# Patient Record
Sex: Female | Born: 1985 | Race: Black or African American | Hispanic: No | Smoking: Former smoker
Health system: Southern US, Community
[De-identification: ages and names within clinical notes are randomized; demographics above are authoritative.]

## PROBLEM LIST (undated history)

## (undated) DIAGNOSIS — J45909 Unspecified asthma, uncomplicated: Secondary | ICD-10-CM

## (undated) DIAGNOSIS — E669 Obesity, unspecified: Secondary | ICD-10-CM

## (undated) DIAGNOSIS — J189 Pneumonia, unspecified organism: Secondary | ICD-10-CM

## (undated) HISTORY — PX: WRIST SURGERY: SHX841

## (undated) HISTORY — PX: OTHER SURGICAL HISTORY: SHX169

---
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
## 2006-04-29 ENCOUNTER — Emergency Department (HOSPITAL_COMMUNITY): Admission: EM | Admit: 2006-04-29 | Discharge: 2006-04-29 | Payer: Self-pay | Admitting: Emergency Medicine

## 2006-05-06 ENCOUNTER — Inpatient Hospital Stay (HOSPITAL_COMMUNITY): Admission: AD | Admit: 2006-05-06 | Discharge: 2006-05-07 | Payer: Self-pay | Admitting: Obstetrics and Gynecology

## 2006-06-02 ENCOUNTER — Emergency Department (HOSPITAL_COMMUNITY): Admission: EM | Admit: 2006-06-02 | Discharge: 2006-06-02 | Payer: Self-pay | Admitting: Family Medicine

## 2006-06-18 ENCOUNTER — Ambulatory Visit: Payer: Self-pay | Admitting: *Deleted

## 2006-06-18 ENCOUNTER — Inpatient Hospital Stay (HOSPITAL_COMMUNITY): Admission: AD | Admit: 2006-06-18 | Discharge: 2006-06-18 | Payer: Self-pay | Admitting: Obstetrics & Gynecology

## 2006-06-24 ENCOUNTER — Ambulatory Visit: Payer: Self-pay | Admitting: Obstetrics and Gynecology

## 2006-06-25 ENCOUNTER — Ambulatory Visit (HOSPITAL_COMMUNITY): Admission: RE | Admit: 2006-06-25 | Discharge: 2006-06-25 | Payer: Self-pay | Admitting: Obstetrics & Gynecology

## 2006-07-07 ENCOUNTER — Inpatient Hospital Stay (HOSPITAL_COMMUNITY): Admission: AD | Admit: 2006-07-07 | Discharge: 2006-07-08 | Payer: Self-pay | Admitting: Obstetrics and Gynecology

## 2006-07-07 ENCOUNTER — Ambulatory Visit: Payer: Self-pay | Admitting: *Deleted

## 2006-07-22 ENCOUNTER — Ambulatory Visit: Payer: Self-pay | Admitting: *Deleted

## 2006-07-24 ENCOUNTER — Ambulatory Visit: Payer: Self-pay | Admitting: Obstetrics & Gynecology

## 2006-07-24 ENCOUNTER — Encounter: Payer: Self-pay | Admitting: Obstetrics & Gynecology

## 2006-08-05 ENCOUNTER — Ambulatory Visit: Payer: Self-pay | Admitting: Obstetrics & Gynecology

## 2006-08-26 ENCOUNTER — Ambulatory Visit: Payer: Self-pay | Admitting: Obstetrics & Gynecology

## 2006-09-09 ENCOUNTER — Ambulatory Visit: Payer: Self-pay | Admitting: *Deleted

## 2006-09-14 ENCOUNTER — Ambulatory Visit: Payer: Self-pay | Admitting: Obstetrics & Gynecology

## 2006-09-15 ENCOUNTER — Ambulatory Visit: Payer: Self-pay | Admitting: Obstetrics & Gynecology

## 2006-09-21 ENCOUNTER — Inpatient Hospital Stay (HOSPITAL_COMMUNITY): Admission: AD | Admit: 2006-09-21 | Discharge: 2006-09-21 | Payer: Self-pay | Admitting: Obstetrics and Gynecology

## 2006-09-21 ENCOUNTER — Ambulatory Visit: Payer: Self-pay | Admitting: *Deleted

## 2006-09-23 ENCOUNTER — Ambulatory Visit: Payer: Self-pay | Admitting: Obstetrics & Gynecology

## 2006-09-30 ENCOUNTER — Ambulatory Visit: Payer: Self-pay | Admitting: Obstetrics & Gynecology

## 2006-10-01 ENCOUNTER — Ambulatory Visit: Payer: Self-pay | Admitting: Gynecology

## 2006-10-01 ENCOUNTER — Ambulatory Visit (HOSPITAL_COMMUNITY): Admission: RE | Admit: 2006-10-01 | Discharge: 2006-10-01 | Payer: Self-pay | Admitting: Family Medicine

## 2006-10-05 ENCOUNTER — Ambulatory Visit: Payer: Self-pay | Admitting: Obstetrics & Gynecology

## 2006-10-07 ENCOUNTER — Ambulatory Visit: Payer: Self-pay | Admitting: Obstetrics & Gynecology

## 2006-10-10 ENCOUNTER — Ambulatory Visit: Payer: Self-pay | Admitting: Obstetrics & Gynecology

## 2006-10-10 ENCOUNTER — Inpatient Hospital Stay (HOSPITAL_COMMUNITY): Admission: AD | Admit: 2006-10-10 | Discharge: 2006-10-10 | Payer: Self-pay | Admitting: Obstetrics and Gynecology

## 2006-10-12 ENCOUNTER — Ambulatory Visit: Payer: Self-pay | Admitting: Obstetrics and Gynecology

## 2006-10-12 ENCOUNTER — Inpatient Hospital Stay (HOSPITAL_COMMUNITY): Admission: AD | Admit: 2006-10-12 | Discharge: 2006-10-14 | Payer: Self-pay | Admitting: Obstetrics and Gynecology

## 2006-12-03 ENCOUNTER — Ambulatory Visit: Payer: Self-pay | Admitting: Obstetrics and Gynecology

## 2006-12-03 ENCOUNTER — Encounter: Payer: Self-pay | Admitting: Obstetrics and Gynecology

## 2006-12-16 ENCOUNTER — Ambulatory Visit: Payer: Self-pay | Admitting: Obstetrics & Gynecology

## 2007-03-15 ENCOUNTER — Emergency Department (HOSPITAL_COMMUNITY): Admission: EM | Admit: 2007-03-15 | Discharge: 2007-03-15 | Payer: Self-pay | Admitting: Family Medicine

## 2007-03-21 ENCOUNTER — Emergency Department (HOSPITAL_COMMUNITY): Admission: EM | Admit: 2007-03-21 | Discharge: 2007-03-21 | Payer: Self-pay | Admitting: Family Medicine

## 2011-01-03 LAB — POCT RAPID STREP A: Streptococcus, Group A Screen (Direct): NEGATIVE

## 2011-01-09 LAB — POCT PREGNANCY, URINE: Preg Test, Ur: NEGATIVE

## 2011-01-13 LAB — CBC
MCHC: 32.2
MCV: 74.2 — ABNORMAL LOW
Platelets: 218
WBC: 10.6 — ABNORMAL HIGH

## 2011-01-14 LAB — POCT URINALYSIS DIP (DEVICE)
Nitrite: NEGATIVE
Operator id: 148111
Operator id: 159681
Protein, ur: 30 — AB
Protein, ur: NEGATIVE
Urobilinogen, UA: 0.2
Urobilinogen, UA: 0.2

## 2011-01-14 LAB — CBC
Platelets: 284
WBC: 10.7 — ABNORMAL HIGH

## 2011-01-14 LAB — RPR: RPR Ser Ql: NONREACTIVE

## 2011-01-15 LAB — URINALYSIS, ROUTINE W REFLEX MICROSCOPIC
Bilirubin Urine: NEGATIVE
Nitrite: NEGATIVE
Specific Gravity, Urine: 1.02
Urobilinogen, UA: 0.2

## 2011-01-15 LAB — POCT URINALYSIS DIP (DEVICE)
Glucose, UA: NEGATIVE
Hgb urine dipstick: NEGATIVE
Nitrite: NEGATIVE

## 2011-01-15 LAB — URINE MICROSCOPIC-ADD ON

## 2011-01-15 LAB — WET PREP, GENITAL: Clue Cells Wet Prep HPF POC: NONE SEEN

## 2011-01-16 LAB — POCT URINALYSIS DIP (DEVICE)
Bilirubin Urine: NEGATIVE
Glucose, UA: NEGATIVE
Hgb urine dipstick: NEGATIVE
Nitrite: NEGATIVE
Operator id: 159681

## 2011-09-17 ENCOUNTER — Emergency Department (INDEPENDENT_AMBULATORY_CARE_PROVIDER_SITE_OTHER)
Admission: EM | Admit: 2011-09-17 | Discharge: 2011-09-17 | Disposition: A | Discharge status: Home / Self Care | Source: Home / Self Care | Attending: Family Medicine | Admitting: Family Medicine

## 2011-09-17 ENCOUNTER — Encounter (HOSPITAL_COMMUNITY): Payer: Self-pay | Admitting: *Deleted

## 2011-09-17 DIAGNOSIS — N912 Amenorrhea, unspecified: Secondary | ICD-10-CM

## 2011-09-17 DIAGNOSIS — N911 Secondary amenorrhea: Secondary | ICD-10-CM

## 2011-09-17 HISTORY — DX: Unspecified asthma, uncomplicated: J45.909

## 2011-09-17 LAB — POCT PREGNANCY, URINE: Preg Test, Ur: NEGATIVE

## 2011-09-17 NOTE — ED Notes (Addendum)
Pt requesting pregnancy test last menstrual cycle 4/10 - pt has IUD but has been having periods since October 2012

## 2011-09-17 NOTE — ED Provider Notes (Signed)
History     CSN: [id number]  Arrival date & time 09/17/11  1333   First MD Initiated Contact with Patient 09/17/11 1337      Chief Complaint  Patient presents with  . Possible Pregnancy    (Consider location/radiation/quality/duration/timing/severity/associated sxs/prior treatment) Patient is a 26 y.o. female presenting with female genitourinary complaint. The history is provided by the patient.  Female GU Problem Primary symptoms include no pelvic pain and no vaginal bleeding. There has been no fever. This is a new problem. She is not pregnant. Her LMP was months ago. The patient's menstrual history has been regular (on merina iud for 65yr, , no menses since 4/10). She uses an IUD for contraception. Associated medical issues comments: g2p2 , no other sx..    Past Medical History  Diagnosis Date  . Asthma     Past Surgical History  Procedure Date  . Wrist surgery     No family history on file.  History  Substance Use Topics  . Smoking status: Current Everyday Smoker  . Smokeless tobacco: Not on file  . Alcohol Use: Yes    OB History    Grav Para Term Preterm Abortions TAB SAB Ect Mult Living                  Review of Systems  Genitourinary: Negative for vaginal bleeding and pelvic pain.  All other systems reviewed and are negative.    Allergies  Review of patient's allergies indicates no known allergies.  Home Medications  No current outpatient prescriptions on file.  BP 102/63  Pulse 90  Temp 98.2 F (36.8 C) (Oral)  Resp 16  SpO2 100%  LMP 07/03/2011  Physical Exam  Nursing note and vitals reviewed. Constitutional: She is oriented to person, place, and time. She appears well-developed and well-nourished.  Abdominal: Soft. Bowel sounds are normal. There is no tenderness.  Neurological: She is alert and oriented to person, place, and time.  Skin: Skin is warm and dry.    ED Course  Procedures (including critical care time)   Labs Reviewed    POCT PREGNANCY, URINE   No results found.   1. Secondary physiologic amenorrhea       MDM  upreg --neg        Linna Hoff, MD 09/17/11 1544

## 2011-09-17 NOTE — Discharge Instructions (Signed)
See your gynecologist for iud change.

## 2012-10-02 ENCOUNTER — Emergency Department (HOSPITAL_COMMUNITY)
Admission: EM | Admit: 2012-10-02 | Discharge: 2012-10-02 | Disposition: A | Discharge status: Home / Self Care | Payer: Medicaid Other | Source: Home / Self Care | Attending: Emergency Medicine | Admitting: Emergency Medicine

## 2012-10-02 ENCOUNTER — Encounter (HOSPITAL_COMMUNITY): Payer: Self-pay | Admitting: Emergency Medicine

## 2012-10-02 DIAGNOSIS — R110 Nausea: Secondary | ICD-10-CM

## 2012-10-02 LAB — COMPREHENSIVE METABOLIC PANEL
ALT: 12 U/L (ref 0–35)
AST: 15 U/L (ref 0–37)
Alkaline Phosphatase: 62 U/L (ref 39–117)
CO2: 26 mEq/L (ref 19–32)
GFR calc Af Amer: >90 mL/min (ref >90)
GFR calc non Af Amer: 89 mL/min — ABNORMAL LOW (ref >90)
Glucose, Bld: 84 mg/dL (ref 70–99)
Potassium: 4 mEq/L (ref 3.5–5.1)
Sodium: 141 mEq/L (ref 135–145)
Total Protein: 6.9 g/dL (ref 6.0–8.3)

## 2012-10-02 LAB — CBC WITH DIFFERENTIAL/PLATELET
Basophils Absolute: 0 10*3/uL (ref 0.0–0.1)
Lymphocytes Relative: 32 % (ref 12–46)
Lymphs Abs: 2.5 10*3/uL (ref 0.7–4.0)
Neutrophils Relative %: 56 % (ref 43–77)
Platelets: 208 10*3/uL (ref 150–400)
RBC: 4.42 MIL/uL (ref 3.87–5.11)
WBC: 7.8 10*3/uL (ref 4.0–10.5)

## 2012-10-02 LAB — POCT H PYLORI SCREEN: H. PYLORI SCREEN, POC: NEGATIVE

## 2012-10-02 LAB — POCT I-STAT, CHEM 8
BUN: 12 mg/dL (ref 6–23)
Calcium, Ion: 1.24 mmol/L — ABNORMAL HIGH (ref 1.12–1.23)
Glucose, Bld: 82 mg/dL (ref 70–99)
TCO2: 26 mmol/L (ref 0–100)

## 2012-10-02 LAB — POCT URINALYSIS DIP (DEVICE)
Bilirubin Urine: NEGATIVE
Glucose, UA: NEGATIVE mg/dL
Nitrite: NEGATIVE

## 2012-10-02 LAB — POCT PREGNANCY, URINE: Preg Test, Ur: NEGATIVE

## 2012-10-02 MED ORDER — ONDANSETRON 4 MG PO TBDP
ORAL_TABLET | ORAL | Status: AC
  Filled 2012-10-02: qty 2

## 2012-10-02 MED ORDER — ONDANSETRON 8 MG PO TBDP: 8.0000 mg | ORAL_TABLET | Freq: Three times a day (TID) | ORAL | Status: DC | PRN

## 2012-10-02 MED ORDER — ONDANSETRON 4 MG PO TBDP
8.0000 mg | ORAL_TABLET | Freq: Once | ORAL | Status: AC
  Administered 2012-10-02: 8 mg via ORAL

## 2012-10-02 NOTE — ED Notes (Signed)
Pt c/o stomach pain that comes and goes. Pt is having some mild nausea, denies vomiting. Mild diarrhea. Denies any other symptoms. Pt is sitting upright no signs of distress.

## 2012-10-02 NOTE — ED Provider Notes (Signed)
Medical screening examination/treatment/procedure(s) were performed by non-physician practitioner and as supervising physician I was immediately available for consultation/collaboration.  Leslee Home, M.D.  Reuben Likes, MD 10/02/12 [phone]

## 2012-10-02 NOTE — ED Provider Notes (Signed)
History    CSN: [id number] Arrival date & time 10/02/12  1359  First MD Initiated Contact with Patient 10/02/12 1431     Chief Complaint  Patient presents with  . Abdominal Pain    comes and goes. nausea.mild diarrhea.    (Consider location/radiation/quality/duration/timing/severity/associated sxs/prior Treatment) HPI Comments: 27 year old female presents complaining of intermittent nausea with occasional vomiting as well as intermittent diarrhea for the past 2 months. She also has some associated abdominal pain in the left lower quadrant. As she says the nausea is the worst part of it. It can be brought on by smelling food or sometimes present first thing in the mornings. When she is nauseous first thing in the morning, that is usually when she has the episodes of vomiting. There are no alleviating or exacerbating factors. She does not recall her last menstrual period but does say she has a very old IUD in that she cannot get anyone to remove. She denies fever, chills, rash.  Patient is a 27 y.o. female presenting with abdominal pain.  Abdominal Pain Associated symptoms include abdominal pain. Pertinent negatives include no chest pain and no shortness of breath.   Past Medical History  Diagnosis Date  . Asthma    Past Surgical History  Procedure Laterality Date  . Wrist surgery     History reviewed. No pertinent family history. History  Substance Use Topics  . Smoking status: Current Every Day Smoker  . Smokeless tobacco: Not on file  . Alcohol Use: Yes   OB History   Grav Para Term Preterm Abortions TAB SAB Ect Mult Living                 Review of Systems  Constitutional: Negative for fever and chills.  Eyes: Negative for visual disturbance.  Respiratory: Negative for cough and shortness of breath.   Cardiovascular: Negative for chest pain, palpitations and leg swelling.  Gastrointestinal: Positive for nausea, vomiting, abdominal pain and diarrhea. Negative for  constipation and abdominal distention.  Endocrine: Negative for polydipsia and polyuria.  Genitourinary: Negative for dysuria, urgency and frequency.  Musculoskeletal: Negative for myalgias and arthralgias.  Skin: Negative for rash.  Neurological: Negative for dizziness, weakness and light-headedness.    Allergies  Review of patient's allergies indicates no known allergies.  Home Medications   Current Outpatient Rx  Name  Route  Sig  Dispense  Refill  . ondansetron (ZOFRAN-ODT) 8 MG disintegrating tablet   Oral   Take 1 tablet (8 mg total) by mouth every 8 (eight) hours as needed for nausea.   20 tablet   0    BP 125/66  Pulse 76  Temp(Src) 98.7 F (37.1 C) (Oral)  SpO2 100% Physical Exam  Nursing note and vitals reviewed. Constitutional: She is oriented to person, place, and time. Vital signs are normal. She appears well-developed and well-nourished. No distress.  Obese habitus   HENT:  Head: Atraumatic.  Eyes: EOM are normal. Pupils are equal, round, and reactive to light.  Cardiovascular: Normal rate, regular rhythm and normal heart sounds.  Exam reveals no gallop and no friction rub.   No murmur heard. Pulmonary/Chest: Effort normal and breath sounds normal. No respiratory distress. She has no wheezes. She has no rales.  Abdominal: Soft. Normal appearance and bowel sounds are normal. There is no hepatosplenomegaly. There is tenderness in the left lower quadrant. There is no rigidity, no rebound, no guarding, no CVA tenderness, no tenderness at McBurney's point and negative Murphy's sign. No hernia.  Neurological: She is alert and oriented to person, place, and time. She has normal strength.  Skin: Skin is warm and dry. She is not diaphoretic.  Psychiatric: She has a normal mood and affect. Her behavior is normal. Judgment normal.    ED Course  Procedures (including critical care time) Labs Reviewed  CBC WITH DIFFERENTIAL - Abnormal; Notable for the following:     Hemoglobin 11.9 (*)    HCT 35.4 (*)    Eosinophils Relative 6 (*)    All other components within normal limits  POCT URINALYSIS DIP (DEVICE) - Abnormal; Notable for the following:    Leukocytes, UA TRACE (*)    All other components within normal limits  POCT I-STAT, CHEM 8 - Abnormal; Notable for the following:    Calcium, Ion 1.24 (*)    All other components within normal limits  COMPREHENSIVE METABOLIC PANEL  POCT PREGNANCY, URINE  POCT H PYLORI SCREEN   No results found. 1. Nausea     MDM  No clear cause found during PE or labs.  Will treat her nausea with zofran ODT bc that did help here and she will f/u with her PCP.  If CMP is abnormal we will call her.     Meds ordered this encounter  Medications  . ondansetron (ZOFRAN-ODT) disintegrating tablet 8 mg    Sig:   . ondansetron (ZOFRAN-ODT) 8 MG disintegrating tablet    Sig: Take 1 tablet (8 mg total) by mouth every 8 (eight) hours as needed for nausea.    Dispense:  20 tablet    Refill:  0     Graylon Good, PA-C 10/02/12 1605

## 2012-12-12 ENCOUNTER — Emergency Department (HOSPITAL_COMMUNITY): Payer: Medicaid Other

## 2012-12-12 ENCOUNTER — Emergency Department (HOSPITAL_COMMUNITY)
Admission: EM | Admit: 2012-12-12 | Discharge: 2012-12-12 | Disposition: A | Discharge status: Home / Self Care | Payer: Medicaid Other | Attending: Emergency Medicine | Admitting: Emergency Medicine

## 2012-12-12 ENCOUNTER — Encounter (HOSPITAL_COMMUNITY): Payer: Self-pay | Admitting: Emergency Medicine

## 2012-12-12 DIAGNOSIS — R112 Nausea with vomiting, unspecified: Secondary | ICD-10-CM | POA: Insufficient documentation

## 2012-12-12 DIAGNOSIS — J45909 Unspecified asthma, uncomplicated: Secondary | ICD-10-CM | POA: Insufficient documentation

## 2012-12-12 DIAGNOSIS — Z3202 Encounter for pregnancy test, result negative: Secondary | ICD-10-CM | POA: Insufficient documentation

## 2012-12-12 DIAGNOSIS — F17200 Nicotine dependence, unspecified, uncomplicated: Secondary | ICD-10-CM | POA: Insufficient documentation

## 2012-12-12 DIAGNOSIS — N390 Urinary tract infection, site not specified: Secondary | ICD-10-CM | POA: Insufficient documentation

## 2012-12-12 LAB — COMPREHENSIVE METABOLIC PANEL
Albumin: 4.4 g/dL (ref 3.5–5.2)
BUN: 13 mg/dL (ref 6–23)
Chloride: 104 mEq/L (ref 96–112)
Creatinine, Ser: 0.97 mg/dL (ref 0.50–1.10)
GFR calc Af Amer: >90 mL/min (ref >90)
Glucose, Bld: 93 mg/dL (ref 70–99)
Total Bilirubin: 0.2 mg/dL — ABNORMAL LOW (ref 0.3–1.2)
Total Protein: 7.1 g/dL (ref 6.0–8.3)

## 2012-12-12 LAB — CBC WITH DIFFERENTIAL/PLATELET
Basophils Relative: 0 % (ref 0–1)
Eosinophils Absolute: 0.3 10*3/uL (ref 0.0–0.7)
HCT: 38.1 % (ref 36.0–46.0)
Hemoglobin: 12.7 g/dL (ref 12.0–15.0)
MCH: 26.5 pg (ref 26.0–34.0)
MCHC: 33.3 g/dL (ref 30.0–36.0)
Monocytes Absolute: 0.5 10*3/uL (ref 0.1–1.0)
Monocytes Relative: 7 % (ref 3–12)

## 2012-12-12 LAB — URINE MICROSCOPIC-ADD ON

## 2012-12-12 LAB — URINALYSIS, ROUTINE W REFLEX MICROSCOPIC
Glucose, UA: NEGATIVE mg/dL
Nitrite: NEGATIVE
Protein, ur: NEGATIVE mg/dL
Urobilinogen, UA: 1 mg/dL (ref 0.0–1.0)

## 2012-12-12 LAB — POCT PREGNANCY, URINE: Preg Test, Ur: NEGATIVE

## 2012-12-12 MED ORDER — ONDANSETRON 4 MG PO TBDP
4.0000 mg | ORAL_TABLET | Freq: Once | ORAL | Status: AC
  Administered 2012-12-12: 4 mg via ORAL
  Filled 2012-12-12: qty 1

## 2012-12-12 MED ORDER — ONDANSETRON 8 MG PO TBDP: 8.0000 mg | ORAL_TABLET | Freq: Three times a day (TID) | ORAL | Status: DC | PRN

## 2012-12-12 MED ORDER — CEPHALEXIN 500 MG PO CAPS: 500.0000 mg | ORAL_CAPSULE | Freq: Four times a day (QID) | ORAL | Status: DC

## 2012-12-12 MED ORDER — PHENAZOPYRIDINE HCL 200 MG PO TABS: 200.0000 mg | ORAL_TABLET | Freq: Three times a day (TID) | ORAL | Status: DC

## 2012-12-12 NOTE — ED Notes (Signed)
Pt c/o intermittent abdominal pain with nausea. Pt reports nausea for past 2 1/2 months with the abdominal pain intermittently ongoing longer. Pt reports has IUD placed 6 years ago. Pt has tried to get her Dr to take it out but they won't because they did not put it in and [facility] is not accepting new pts.

## 2012-12-12 NOTE — ED Provider Notes (Addendum)
CSN: [id number]     Arrival date & time 12/12/12  0800 History  First MD Initiated Contact with Patient 12/12/12 0805     Chief Complaint  Patient presents with  . Abdominal Pain  . Nausea  . Emesis    Patient is a 27 y.o. female presenting with abdominal pain and vomiting.  Abdominal Pain Associated symptoms: vomiting   Associated symptoms: no dysuria and no fever   Emesis Associated symptoms: abdominal pain    Pt woke up with a little bit of abdominal discomfort.  It was more on the left hand side generally in the lower abdomen.It has resolved but on her way to work this morning she had to pull over on the side of the road to vomit.  She continues to feel nauseated but not as bad.   She has been having this trouble off and on for the last few months with the nausea and even longer the pain.  She has tried to see her PCP GYN to get her IUD removed but they want her to see the providers that did the procedure and [facility]. Pt cant think of anything that brings it on.  No relation to eating or food. Not at a certain time of the day. Past Medical History  Diagnosis Date  . Asthma    Past Surgical History  Procedure Laterality Date  . Wrist surgery     No family history on file. History  Substance Use Topics  . Smoking status: Current Every Day Smoker  . Smokeless tobacco: Not on file  . Alcohol Use: Yes   OB History   Grav Para Term Preterm Abortions TAB SAB Ect Mult Living                 Review of Systems  Constitutional: Negative for fever.  Gastrointestinal: Positive for vomiting and abdominal pain.  Genitourinary: Negative for dysuria.  All other systems reviewed and are negative.    Allergies  Review of patient's allergies indicates no known allergies.  Home Medications   Current Outpatient Rx  Name  Route  Sig  Dispense  Refill  . naproxen sodium (ANAPROX) 220 MG tablet   Oral   Take 220-440 mg by mouth daily as needed  (Joint pain).         . cephALEXin (KEFLEX) 500 MG capsule   Oral   Take 1 capsule (500 mg total) by mouth 4 (four) times daily.   20 capsule   0   . ondansetron (ZOFRAN ODT) 8 MG disintegrating tablet   Oral   Take 1 tablet (8 mg total) by mouth every 8 (eight) hours as needed for nausea.   20 tablet   0   . phenazopyridine (PYRIDIUM) 200 MG tablet   Oral   Take 1 tablet (200 mg total) by mouth 3 (three) times daily.   6 tablet   0    BP 118/65  Pulse 65  Temp(Src) 98.3 F (36.8 C) (Oral)  Resp 18  Ht 5\' 6"  (1.676 m)  SpO2 100% Physical Exam  Nursing note and vitals reviewed. Constitutional: She appears well-developed and well-nourished. No distress.  HENT:  Head: Normocephalic and atraumatic.  Right Ear: External ear normal.  Left Ear: External ear normal.  Eyes: Conjunctivae are normal. Right eye exhibits no discharge. Left eye exhibits no discharge. No scleral icterus.  Neck: Neck supple. No tracheal deviation present.  Cardiovascular: Normal rate, regular rhythm and intact distal  pulses.   Pulmonary/Chest: Effort normal and breath sounds normal. No stridor. No respiratory distress. She has no wheezes. She has no rales.  Abdominal: Soft. Bowel sounds are normal. She exhibits no distension. There is no tenderness. There is no rebound and no guarding.  Musculoskeletal: She exhibits no edema and no tenderness.  Neurological: She is alert. She has normal strength. No sensory deficit. Cranial nerve deficit:  no gross defecits noted. She exhibits normal muscle tone. She displays no seizure activity. Coordination normal.  Skin: Skin is warm and dry. No rash noted.  Psychiatric: She has a normal mood and affect.    ED Course  Procedures (including critical care time) Labs Review Labs Reviewed  COMPREHENSIVE METABOLIC PANEL - Abnormal; Notable for the following:    Total Bilirubin 0.2 (*)    GFR calc non Af Amer 80 (*)    All other components within normal limits   URINALYSIS, ROUTINE W REFLEX MICROSCOPIC - Abnormal; Notable for the following:    APPearance CLOUDY (*)    Leukocytes, UA MODERATE (*)    All other components within normal limits  URINE MICROSCOPIC-ADD ON - Abnormal; Notable for the following:    Squamous Epithelial / LPF FEW (*)    Bacteria, UA FEW (*)    All other components within normal limits  URINE CULTURE  CBC WITH DIFFERENTIAL  POCT PREGNANCY, URINE   Imaging Review Dg Abd Acute W/chest  12/12/2012   CLINICAL DATA:  Abdominal pain, nausea, vomiting  EXAM: ACUTE ABDOMEN SERIES (ABDOMEN 2 VIEW & CHEST 1 VIEW)  COMPARISON:  None.  FINDINGS: Cardiomediastinal silhouette is unremarkable. No acute infiltrate or pleural effusion. No pulmonary edema. There is nonspecific nonobstructive bowel gas pattern. No free abdominal air. Some stool are noted in right colon and transverse colon. IUD noted midline pelvis.  IMPRESSION: No acute disease. Nonspecific nonobstructive bowel gas pattern. No free abdominal air.   Electronically Signed   By: Tomasita Mead   On: 12/12/2012 09:36    MDM   1. UTI (urinary tract infection)     the patient's evaluation is consistent with a urinary tract infection. Discharge her home on a course of oral antibiotics as well as Zofran and Pyridium for symptomatic relief. I encouraged her to followup with a gynecologist regarding her IUD. Plain x-rays suggested it is in appropriate location.    Celene Kras, MD 12/12/12 1135  Celene Kras, MD 12/21/12 [phone]

## 2012-12-13 LAB — URINE CULTURE: Colony Count: 40000

## 2013-01-17 ENCOUNTER — Encounter (HOSPITAL_COMMUNITY): Payer: Self-pay | Admitting: Emergency Medicine

## 2013-01-17 DIAGNOSIS — N390 Urinary tract infection, site not specified: Secondary | ICD-10-CM | POA: Insufficient documentation

## 2013-01-17 DIAGNOSIS — J45909 Unspecified asthma, uncomplicated: Secondary | ICD-10-CM | POA: Insufficient documentation

## 2013-01-17 DIAGNOSIS — Z8701 Personal history of pneumonia (recurrent): Secondary | ICD-10-CM | POA: Insufficient documentation

## 2013-01-17 DIAGNOSIS — R1084 Generalized abdominal pain: Secondary | ICD-10-CM | POA: Insufficient documentation

## 2013-01-17 DIAGNOSIS — A5909 Other urogenital trichomoniasis: Secondary | ICD-10-CM | POA: Insufficient documentation

## 2013-01-17 DIAGNOSIS — F17200 Nicotine dependence, unspecified, uncomplicated: Secondary | ICD-10-CM | POA: Insufficient documentation

## 2013-01-17 DIAGNOSIS — Z79899 Other long term (current) drug therapy: Secondary | ICD-10-CM | POA: Insufficient documentation

## 2013-01-17 DIAGNOSIS — Z3202 Encounter for pregnancy test, result negative: Secondary | ICD-10-CM | POA: Insufficient documentation

## 2013-01-17 LAB — COMPREHENSIVE METABOLIC PANEL
ALT: 13 U/L (ref 0–35)
Albumin: 3.9 g/dL (ref 3.5–5.2)
Alkaline Phosphatase: 70 U/L (ref 39–117)
BUN: 15 mg/dL (ref 6–23)
Calcium: 9.3 mg/dL (ref 8.4–10.5)
GFR calc Af Amer: >90 mL/min (ref >90)
Glucose, Bld: 104 mg/dL — ABNORMAL HIGH (ref 70–99)
Potassium: 4.1 mEq/L (ref 3.5–5.1)
Sodium: 140 mEq/L (ref 135–145)
Total Protein: 7 g/dL (ref 6.0–8.3)

## 2013-01-17 LAB — URINALYSIS, ROUTINE W REFLEX MICROSCOPIC
Bilirubin Urine: NEGATIVE
Ketones, ur: NEGATIVE mg/dL
Nitrite: NEGATIVE
Protein, ur: NEGATIVE mg/dL
Urobilinogen, UA: 0.2 mg/dL (ref 0.0–1.0)

## 2013-01-17 LAB — CBC WITH DIFFERENTIAL/PLATELET
Basophils Relative: 0 % (ref 0–1)
Eosinophils Absolute: 0.4 10*3/uL (ref 0.0–0.7)
Eosinophils Relative: 3 % (ref 0–5)
MCH: 26.6 pg (ref 26.0–34.0)
MCHC: 33.1 g/dL (ref 30.0–36.0)
MCV: 80.3 fL (ref 78.0–100.0)
Neutrophils Relative %: 72 % (ref 43–77)
Platelets: 217 10*3/uL (ref 150–400)

## 2013-01-17 LAB — POCT PREGNANCY, URINE: Preg Test, Ur: NEGATIVE

## 2013-01-17 LAB — URINE MICROSCOPIC-ADD ON

## 2013-01-17 NOTE — ED Notes (Signed)
Pt. reports generalized abdominal pain onset this afternoon , denies nausea/vomitting or diarrhea . No fever or chills. Pt. stated vaginal bleeding onset today.

## 2013-01-18 ENCOUNTER — Emergency Department (HOSPITAL_COMMUNITY)
Admission: EM | Admit: 2013-01-18 | Discharge: 2013-01-18 | Disposition: A | Discharge status: Home / Self Care | Payer: Self-pay | Attending: Emergency Medicine | Admitting: Emergency Medicine

## 2013-01-18 ENCOUNTER — Emergency Department (HOSPITAL_COMMUNITY): Payer: Medicaid Other

## 2013-01-18 ENCOUNTER — Encounter (HOSPITAL_COMMUNITY): Payer: Self-pay | Admitting: Radiology

## 2013-01-18 ENCOUNTER — Emergency Department (HOSPITAL_COMMUNITY): Payer: Self-pay

## 2013-01-18 DIAGNOSIS — A5909 Other urogenital trichomoniasis: Secondary | ICD-10-CM

## 2013-01-18 DIAGNOSIS — N72 Inflammatory disease of cervix uteri: Secondary | ICD-10-CM

## 2013-01-18 DIAGNOSIS — R109 Unspecified abdominal pain: Secondary | ICD-10-CM

## 2013-01-18 DIAGNOSIS — N390 Urinary tract infection, site not specified: Secondary | ICD-10-CM

## 2013-01-18 HISTORY — DX: Pneumonia, unspecified organism: J18.9

## 2013-01-18 LAB — WET PREP, GENITAL
Clue Cells Wet Prep HPF POC: NONE SEEN
Yeast Wet Prep HPF POC: NONE SEEN

## 2013-01-18 MED ORDER — METRONIDAZOLE 500 MG PO TABS
2000.0000 mg | ORAL_TABLET | Freq: Once | ORAL | Status: AC
  Administered 2013-01-18: 2000 mg via ORAL
  Filled 2013-01-18: qty 4

## 2013-01-18 MED ORDER — SODIUM CHLORIDE 0.9 % IV BOLUS (SEPSIS)
1000.0000 mL | Freq: Once | INTRAVENOUS | Status: AC
  Administered 2013-01-18: 1000 mL via INTRAVENOUS

## 2013-01-18 MED ORDER — HYDROCODONE-ACETAMINOPHEN 5-325 MG PO TABS: 2.0000 | ORAL_TABLET | ORAL | Status: DC | PRN

## 2013-01-18 MED ORDER — DEXTROSE 5 % IV SOLN
1.0000 g | Freq: Once | INTRAVENOUS | Status: AC
  Administered 2013-01-18: 1 g via INTRAVENOUS
  Filled 2013-01-18: qty 10

## 2013-01-18 MED ORDER — IOHEXOL 300 MG/ML  SOLN
100.0000 mL | Freq: Once | INTRAMUSCULAR | Status: AC | PRN
  Administered 2013-01-18: 100 mL via INTRAVENOUS

## 2013-01-18 MED ORDER — MORPHINE SULFATE 4 MG/ML IJ SOLN
4.0000 mg | Freq: Once | INTRAMUSCULAR | Status: AC
  Administered 2013-01-18: 4 mg via INTRAVENOUS
  Filled 2013-01-18: qty 1

## 2013-01-18 MED ORDER — AZITHROMYCIN 1 G PO PACK
1.0000 g | PACK | Freq: Once | ORAL | Status: AC
  Administered 2013-01-18: 1 g via ORAL
  Filled 2013-01-18: qty 1

## 2013-01-18 MED ORDER — ONDANSETRON HCL 4 MG/2ML IJ SOLN
4.0000 mg | Freq: Once | INTRAMUSCULAR | Status: AC
  Administered 2013-01-18: 4 mg via INTRAVENOUS
  Filled 2013-01-18: qty 2

## 2013-01-18 MED ORDER — IOHEXOL 300 MG/ML  SOLN
20.0000 mL | INTRAMUSCULAR | Status: AC
  Administered 2013-01-18: 25 mL via ORAL

## 2013-01-18 MED ORDER — TRAMADOL HCL 50 MG PO TABS: 50.0000 mg | ORAL_TABLET | Freq: Four times a day (QID) | ORAL | Status: DC | PRN

## 2013-01-18 MED ORDER — ACETAMINOPHEN 325 MG PO TABS
650.0000 mg | ORAL_TABLET | Freq: Once | ORAL | Status: AC
  Administered 2013-01-18: 650 mg via ORAL
  Filled 2013-01-18: qty 2

## 2013-01-18 MED ORDER — DOXYCYCLINE HYCLATE 100 MG PO CAPS: 100.0000 mg | ORAL_CAPSULE | Freq: Two times a day (BID) | ORAL | Status: DC

## 2013-01-18 MED ORDER — MORPHINE SULFATE 2 MG/ML IJ SOLN
2.0000 mg | Freq: Once | INTRAMUSCULAR | Status: AC
  Administered 2013-01-18: 2 mg via INTRAVENOUS
  Filled 2013-01-18: qty 1

## 2013-01-18 MED ORDER — CEPHALEXIN 500 MG PO CAPS: 500.0000 mg | ORAL_CAPSULE | Freq: Four times a day (QID) | ORAL | Status: DC

## 2013-01-18 NOTE — ED Notes (Signed)
Dr. Arnoldo Morale informed of pt's VS-- orders received-- will not discharge at this time-- will recheck temp after Tylenol-

## 2013-01-18 NOTE — ED Provider Notes (Signed)
CSN: [id number]     Arrival date & time 01/17/13  2155 History   First MD Initiated Contact with Patient 01/18/13 0023     Chief Complaint  Patient presents with  . Abdominal Pain   (Consider location/radiation/quality/duration/timing/severity/associated sxs/prior Treatment) HPI Patient is a generally healthy 27 yo woman who presents with complaints of diffuse abdominal pain. Sx began around 3pm yesterday afternoon. Patient describes pain as severe and waxing and waning in severity. Pain is worse with changes in position, moving around in bed. Patient says "it feels like trying to push a balloon against something something sharp.  Patient notes that her menstrual period began several hours after pain began. LMP , prior to today, approx 4 weeks ago. However, patient says that this pain - although somewhat similar to usual menstrual cramps, is much more severe than anything which she has previously experienced.   Last BM was approx 12 hrs ago and normal. Pt denies nausea and vomiting. Denies unusual vaginal discharge and dysuria. No history of abdominal surgeries. Pain is currently 10/10.   Past Medical History  Diagnosis Date  . Asthma   . Pneumonia    Past Surgical History  Procedure Laterality Date  . Wrist surgery     No family history on file. History  Substance Use Topics  . Smoking status: Current Every Day Smoker  . Smokeless tobacco: Not on file  . Alcohol Use: Yes   OB History   Grav Para Term Preterm Abortions TAB SAB Ect Mult Living                 Review of Systems 10 point ROS performed and is negative with the exception of sx noted above.   Allergies  Review of patient's allergies indicates no known allergies.  Home Medications   Current Outpatient Rx  Name  Route  Sig  Dispense  Refill  . levonorgestrel (MIRENA) 20 MCG/24HR IUD   Intrauterine   1 each by Intrauterine route once.         . naproxen sodium (ANAPROX) 220 MG tablet   Oral   Take 220-440  mg by mouth daily as needed (Joint pain).         . cephALEXin (KEFLEX) 500 MG capsule   Oral   Take 1 capsule (500 mg total) by mouth 4 (four) times daily.   20 capsule   0   . ondansetron (ZOFRAN ODT) 8 MG disintegrating tablet   Oral   Take 1 tablet (8 mg total) by mouth every 8 (eight) hours as needed for nausea.   20 tablet   0   . phenazopyridine (PYRIDIUM) 200 MG tablet   Oral   Take 1 tablet (200 mg total) by mouth 3 (three) times daily.   6 tablet   0    BP 121/57  Pulse 94  Temp(Src) 99.6 F (37.6 C) (Oral)  Resp 14  Wt 224 lb (101.606 kg)  BMI 36.17 kg/m2  SpO2 100% Physical Exam Gen: well developed and well nourished appearing Head: NCAT Eyes: PERL, EOMI Nose: no epistaixis or rhinorrhea Mouth/throat: mucosa is moist and pink Neck: supple, no stridor Lungs: CTA B, no wheezing, rhonchi or rales CV: RRR, no murmur Abd: soft, diffusely tender, nondistended Back: no ttp, no cva ttp Skin: warm and dry Neuro: CN ii-xii grossly intact, no focal deficits Psyche; mildly anxious affect,  calm and cooperative.     ED Course  Procedures (including critical care time) Labs Review Results for orders  placed during the hospital encounter of 01/18/13 (from the past 24 hour(s))  URINALYSIS, ROUTINE W REFLEX MICROSCOPIC     Status: Abnormal   Collection Time    01/17/13 10:17 PM      Result Value Range   Color, Urine YELLOW  YELLOW   APPearance CLOUDY (*) CLEAR   Specific Gravity, Urine 1.027  1.005 - 1.030   pH 6.5  5.0 - 8.0   Glucose, UA NEGATIVE  NEGATIVE mg/dL   Hgb urine dipstick SMALL (*) NEGATIVE   Bilirubin Urine NEGATIVE  NEGATIVE   Ketones, ur NEGATIVE  NEGATIVE mg/dL   Protein, ur NEGATIVE  NEGATIVE mg/dL   Urobilinogen, UA 0.2  0.0 - 1.0 mg/dL   Nitrite NEGATIVE  NEGATIVE   Leukocytes, UA MODERATE (*) NEGATIVE  URINE MICROSCOPIC-ADD ON     Status: Abnormal   Collection Time    01/17/13 10:17 PM      Result Value Range   Squamous Epithelial  / LPF FEW (*) RARE   WBC, UA 11-20  <3 WBC/hpf   RBC / HPF 3-6  <3 RBC/hpf   Bacteria, UA FEW (*) RARE   Urine-Other MUCOUS PRESENT    CBC WITH DIFFERENTIAL     Status: Abnormal   Collection Time    01/17/13 10:20 PM      Result Value Range   WBC 12.9 (*) 4.0 - 10.5 K/uL   RBC 4.36  3.87 - 5.11 MIL/uL   Hemoglobin 11.6 (*) 12.0 - 15.0 g/dL   HCT 16.1 (*) 09.6 - 04.5 %   MCV 80.3  78.0 - 100.0 fL   MCH 26.6  26.0 - 34.0 pg   MCHC 33.1  30.0 - 36.0 g/dL   RDW 40.9  81.1 - 91.4 %   Platelets 217  150 - 400 K/uL   Neutrophils Relative % 72  43 - 77 %   Neutro Abs 9.3 (*) 1.7 - 7.7 K/uL   Lymphocytes Relative 21  12 - 46 %   Lymphs Abs 2.7  0.7 - 4.0 K/uL   Monocytes Relative 4  3 - 12 %   Monocytes Absolute 0.5  0.1 - 1.0 K/uL   Eosinophils Relative 3  0 - 5 %   Eosinophils Absolute 0.4  0.0 - 0.7 K/uL   Basophils Relative 0  0 - 1 %   Basophils Absolute 0.0  0.0 - 0.1 K/uL  COMPREHENSIVE METABOLIC PANEL     Status: Abnormal   Collection Time    01/17/13 10:20 PM      Result Value Range   Sodium 140  135 - 145 mEq/L   Potassium 4.1  3.5 - 5.1 mEq/L   Chloride 106  96 - 112 mEq/L   CO2 27  19 - 32 mEq/L   Glucose, Bld 104 (*) 70 - 99 mg/dL   BUN 15  6 - 23 mg/dL   Creatinine, Ser 7.82  0.50 - 1.10 mg/dL   Calcium 9.3  8.4 - 95.6 mg/dL   Total Protein 7.0  6.0 - 8.3 g/dL   Albumin 3.9  3.5 - 5.2 g/dL   AST 16  0 - 37 U/L   ALT 13  0 - 35 U/L   Alkaline Phosphatase 70  39 - 117 U/L   Total Bilirubin 0.2 (*) 0.3 - 1.2 mg/dL   GFR calc non Af Amer >90  >90 mL/min   GFR calc Af Amer >90  >90 mL/min  POCT PREGNANCY, URINE  Status: None   Collection Time    01/17/13 10:30 PM      Result Value Range   Preg Test, Ur NEGATIVE  NEGATIVE    EKG Interpretation   None      CT Abdomen Pelvis W Contrast (Final result)  Result time: 01/18/13 03:40:41    Final result by Rad Results In Interface (01/18/13 03:40:41)    Narrative:   CLINICAL DATA: Generalized abdominal pain  since this afternoon. Vaginal bleeding.  EXAM: CT ABDOMEN AND PELVIS WITH CONTRAST  TECHNIQUE: Multidetector CT imaging of the abdomen and pelvis was performed using the standard protocol following bolus administration of intravenous contrast.  CONTRAST: OMNIPAQUE IOHEXOL 300 MG/ML SOLN  COMPARISON: None.  FINDINGS: Mild dependent atelectasis in the lung bases.  The liver, spleen, gallbladder, pancreas, adrenal glands, kidneys, abdominal aorta, inferior vena cava, and retroperitoneal lymph nodes are unremarkable. The stomach, small bowel, and colon are not abnormally distended. No free air or free fluid in the abdomen.  Pelvis: Intrauterine device in place. Uterus and ovaries are not enlarged. Bladder wall is not thickened. Appendix is normal. No diverticulitis. No free or loculated pelvic fluid collections. No significant pelvic lymphadenopathy. Normal alignment of the lumbar spine.  IMPRESSION: No acute process demonstrated in the abdomen or pelvis.   Electronically Signed By: Burman Nieves M.D. On: 01/18/2013 03:40     MDM  DDX: dysmenorrhea, UTI, colitis, IBS, IBD, diverticulitis, PID, TOA, ectopic pregnancy.   Patient noted to have UTI. However, her pain seems out of proportion to simple UTI. Thus, further investigation with CT abd/pelvis and pelvic U/S - both of which are unremarkable.  Pelvic exam notable for some mild CMT. So, we are treating for cervicitis. The patient is feeling much better now, tolerating po intake and is stable for discharge with plan to follow up with her gynecologist.      Brandt Loosen, MD 01/24/13 [phone]

## 2013-01-18 NOTE — ED Notes (Signed)
Patient transported to CT 

## 2013-01-18 NOTE — ED Notes (Signed)
Pt name called with no answer 

## 2013-01-18 NOTE — ED Notes (Signed)
CT paged. 

## 2013-01-18 NOTE — ED Provider Notes (Signed)
Pelvic exam performed per request of Dr. Lavella Lemons.   7:02 AM Pt was sleeping when I entered the room, easily awakened for exam to be performed.  Pt appeared to be in moderate pain while moving to end of bed for pelvic exam.   Pelvic exam: small amount of bright red blood in vaginal canal, consistent with pt being on her menses. No cervical or vaginal discharge noted on exam. Tenderness with speculum exam, CMT with left and right adnexal tenderness without masses.   GC/chlamydia and wet prep ordered.   Junius Finner, PA-C 01/18/13 [phone]

## 2013-01-18 NOTE — ED Notes (Signed)
Pt presents for evaluation of lower abdominal pain onset today described as sharp in nature.  Pt states she has an IUD in place that was supposed to be removed last December - pt was unable to have IUD removed due to insurance issues.  Pt complaining of cramping sensation at present- admits to vaginal bleeding which is abnormal per pt.

## 2013-01-19 LAB — URINE CULTURE: Colony Count: 15000

## 2013-01-19 LAB — GC/CHLAMYDIA PROBE AMP
CT Probe RNA: NEGATIVE
GC Probe RNA: POSITIVE — AB

## 2013-01-19 NOTE — ED Provider Notes (Signed)
Medical screening examination/treatment/procedure(s) were performed by non-physician practitioner and as supervising physician I was immediately available for consultation/collaboration.   Brandt Loosen, MD 01/19/13 [phone]

## 2013-01-20 NOTE — ED Notes (Signed)
+   Gonorrhea  Patient treated with Rocephin And Zithromax-[facility] faxed  

## 2013-03-03 ENCOUNTER — Encounter: Payer: Self-pay | Admitting: Obstetrics & Gynecology

## 2013-03-03 ENCOUNTER — Ambulatory Visit (INDEPENDENT_AMBULATORY_CARE_PROVIDER_SITE_OTHER): Payer: Self-pay | Admitting: Obstetrics & Gynecology

## 2013-03-03 VITALS — BP 130/80 | HR 83 | Ht 66.0 in | Wt 217.9 lb

## 2013-03-03 DIAGNOSIS — A5400 Gonococcal infection of lower genitourinary tract, unspecified: Secondary | ICD-10-CM

## 2013-03-03 DIAGNOSIS — A549 Gonococcal infection, unspecified: Secondary | ICD-10-CM

## 2013-03-03 DIAGNOSIS — N898 Other specified noninflammatory disorders of vagina: Secondary | ICD-10-CM

## 2013-03-03 NOTE — Progress Notes (Signed)
Patient ID: Victoria Cook, female   DOB: 23-Jul-1985, 27 y.o.   MRN: [id number]  Chief Complaint  Patient presents with  . Follow-up    Seen in ED for cervicitis    HPI Victoria Cook is a 27 y.o. female.  No obstetric history on file. No LMP recorded. Patient is not currently having periods (Reason: IUD). Seen for pain 01/18/13 at [facility] and positive GC test, was not informed but received appropriate treatment for cervicitis.  HPI  Past Medical History  Diagnosis Date  . Asthma   . Pneumonia     Past Surgical History  Procedure Laterality Date  . Wrist surgery      Family History  Problem Relation Age of Onset  . Hypertension Mother   . Stroke Mother   . Cancer Maternal Aunt     Social History History  Substance Use Topics  . Smoking status: Current Every Day Smoker  . Smokeless tobacco: Not on file  . Alcohol Use: Yes    No Known Allergies  Current Outpatient Prescriptions  Medication Sig Dispense Refill  . levonorgestrel (MIRENA) 20 MCG/24HR IUD 1 each by Intrauterine route once.      . cephALEXin (KEFLEX) 500 MG capsule Take 1 capsule (500 mg total) by mouth 4 (four) times daily.  20 capsule  0  . cephALEXin (KEFLEX) 500 MG capsule Take 1 capsule (500 mg total) by mouth 4 (four) times daily.  28 capsule  0  . doxycycline (VIBRAMYCIN) 100 MG capsule Take 1 capsule (100 mg total) by mouth 2 (two) times daily.  20 capsule  0  . HYDROcodone-acetaminophen (NORCO) 5-325 MG per tablet Take 2 tablets by mouth every 4 (four) hours as needed for pain.  6 tablet  0  . naproxen sodium (ANAPROX) 220 MG tablet Take 220-440 mg by mouth daily as needed (Joint pain).      . ondansetron (ZOFRAN ODT) 8 MG disintegrating tablet Take 1 tablet (8 mg total) by mouth every 8 (eight) hours as needed for nausea.  20 tablet  0  . phenazopyridine (PYRIDIUM) 200 MG tablet Take 1 tablet (200 mg total) by mouth 3 (three) times daily.  6 tablet  0  . traMADol (ULTRAM) 50 MG tablet Take 1  tablet (50 mg total) by mouth every 6 (six) hours as needed for pain.  15 tablet  0   No current facility-administered medications for this visit.    Review of Systems Review of Systems  Constitutional: Negative for fever.  Genitourinary: Positive for dysuria. Negative for vaginal bleeding, vaginal discharge, menstrual problem and pelvic pain.    Blood pressure 130/80, pulse 83, height 5\' 6"  (1.676 m), weight 217 lb 14.4 oz (98.839 kg).  Physical Exam Physical Exam  Constitutional: She is oriented to person, place, and time. She appears well-developed and well-nourished.  Pulmonary/Chest: Effort normal. No respiratory distress.  Abdominal: Soft.  Genitourinary: Uterus normal. Vaginal discharge found.  STD probe and wet prep  Neurological: She is alert and oriented to person, place, and time.  Skin: Skin is warm and dry.  Psychiatric: She has a normal mood and affect. Her behavior is normal.    Data Reviewed Labs and notes  Assessment    Recent treatment for GC, was not informed     Plan    F/U result  scd and wet prep        Kiowa Hollar 03/03/2013, 2:11 PM

## 2013-03-03 NOTE — Patient Instructions (Signed)
Gonorrhea, Females and Males Gonorrhea is an infection. Gonorrhea can be treated with medicines that kill germs (antibiotics). It is necessary that all your sexual partners also be tested for infection and possibly be treated.  CAUSES  Gonorrhea is caused by a germ (bacteria) called Neisseria gonorrhoeae. This infection is spread by sexual contact. The contact that spreads gonorrhea from person to person may be oral, anal, or genital sex. SYMPTOMS  Females A woman may have gonorrhea infection and no symptoms. The most common symptoms are:  Pain in the lower abdomen.  Fever, with or without chills. When these are the most serious problems, the illness is commonly called pelvic inflammatory disease (PID). Other symptoms include:  Abnormal vaginal discharge.  Painful intercourse.  Burning or itching of the vagina or lips of the vagina.  Abnormal vaginal bleeding.  Pain when urinating. If the infection is spread by anal sex:  Irritation, pain, bleeding, or discharge from the rectum. If the infection is spread by oral sex with either a man or a woman:  Sore throat, fever, and swollen neck lymph glands. Other problems may include:  Long-lasting (chronic) pain in the lower abdomen during menstruation, intercourse, or at other times.  Inability to become pregnant.  Premature birth.  Passing the infection onto a newborn baby. This can cause an eye infection in the infant or more serious health problems. Males Less frequently than in women, men may have gonorrhea infection and no symptoms. The most common symptoms are:  Discharge from the penis.  Pain or burning during urination. If the infection is spread by anal sex:  Irritation, pain, bleeding, or discharge from the rectum. If the infection is spread by oral sex with either a man or a woman:  Sore throat, fever, and swollen neck lymph glands. DIAGNOSIS  Diagnosis is made by exam of the patient and checking a sample of  discharge under a microscope for the presence of the bacteria. Discharge may be taken from the urethra, cervix, throat, or rectum. TREATMENT  It is important to diagnose and treat gonorrhea as soon as possible. This prevents damage to the female or female organs or harm to the newborn baby of an infected woman.  Antibiotics are used to treat gonorrhea.  Your sex partners should also be examined and treated if needed.  Testing and treatment for other sexually transmitted diseases (STDs) may be done when you are diagnosed with gonorrhea. Gonorrhea is an STD. You are at risk for other STDs, which are often transmitted around the same time as gonorrhea. These include:  Chlamydia.  Syphilis.  Trichomonas.  Human papillomavirus (HPV).  Human immunodeficiency virus (HIV).  If left untreated, PID can cause women to be unable to have children (sterile). To prevent sterility in females, it is important to be treated as soon as possible and finish all medicines. Unfortunately, sterility or pregnancy occurring outside the uterus (ectopic) may still occur in fully treated women. HOME CARE INSTRUCTIONS   Finish all medicine as prescribed. Incomplete treatment will put you at risk for continued infection.  Only take over-the-counter or prescription medicines for pain, discomfort, or fever as directed by your caregiver.  Do not have sex until treatment is completed, or as instructed by your caregiver.  Follow up with your caregiver as directed.  If you test positive for gonorrhea, inform your recent sexual partners. They may need an exam and treatment, even if they have no symptoms. They may need treatment even if they test negative for gonorrhea. Finding   out the results of your test Not all test results are available during your visit. If your test results are not back during the visit, make an appointment with your caregiver to find out the results. Do not assume everything is normal if you have not  heard from your caregiver or the medical facility. It is important for you to follow up on all of your test results. SEEK MEDICAL CARE IF:   You develop any bad reaction to the medicine you were prescribed. This may include:  Rash.  Nausea.  Vomiting.  Diarrhea.  You have an oral temperature above 102 F (38.9 C).  You have symptoms that do not improve, symptoms that get worse, or you develop increased pain. Males may get pain in the testicles and females may get increased abdominal pain. MAKE SURE YOU:   Understand these instructions.  Will watch your condition.  Will get help right away if you are not doing well or get worse. Document Released: 03/14/2000 Document Revised: 06/09/2011 Document Reviewed: 09/22/2012 [employer] Patient Information 2014 [employer].  

## 2013-03-04 LAB — WET PREP, GENITAL: Yeast Wet Prep HPF POC: NONE SEEN

## 2013-03-04 LAB — GC/CHLAMYDIA PROBE AMP
CT Probe RNA: NEGATIVE
GC Probe RNA: NEGATIVE

## 2013-03-07 ENCOUNTER — Telehealth: Payer: Self-pay | Admitting: *Deleted

## 2013-03-07 DIAGNOSIS — B9689 Other specified bacterial agents as the cause of diseases classified elsewhere: Secondary | ICD-10-CM

## 2013-03-07 NOTE — Telephone Encounter (Signed)
Called Victoria Cook and left a message we are calling with some information- please call clinic during office hours.

## 2013-03-07 NOTE — Telephone Encounter (Signed)
Message copied by Gerome Apley on Mon Mar 07, 2013  4:04 PM ------      Message from: Adam Phenix      Created: Mon Mar 07, 2013  3:59 PM       Negative STD, RX Flagyl 500 mg BID 7 days BV ------

## 2013-03-08 MED ORDER — METRONIDAZOLE 500 MG PO TABS: 500.0000 mg | ORAL_TABLET | Freq: Two times a day (BID) | ORAL | Status: DC

## 2013-03-08 NOTE — Telephone Encounter (Signed)
Called patient and informed her of results and antibiotic available for pickup. Patient verbalized understanding and had no further questions  

## 2013-06-10 ENCOUNTER — Encounter (HOSPITAL_COMMUNITY): Payer: Self-pay | Admitting: Emergency Medicine

## 2013-06-10 ENCOUNTER — Emergency Department (HOSPITAL_COMMUNITY)
Admission: EM | Admit: 2013-06-10 | Discharge: 2013-06-10 | Disposition: A | Discharge status: Home / Self Care | Payer: Medicaid Other | Attending: Emergency Medicine | Admitting: Emergency Medicine

## 2013-06-10 DIAGNOSIS — M25519 Pain in unspecified shoulder: Secondary | ICD-10-CM | POA: Insufficient documentation

## 2013-06-10 DIAGNOSIS — Z792 Long term (current) use of antibiotics: Secondary | ICD-10-CM | POA: Insufficient documentation

## 2013-06-10 DIAGNOSIS — J45909 Unspecified asthma, uncomplicated: Secondary | ICD-10-CM | POA: Insufficient documentation

## 2013-06-10 DIAGNOSIS — Z8701 Personal history of pneumonia (recurrent): Secondary | ICD-10-CM | POA: Insufficient documentation

## 2013-06-10 DIAGNOSIS — M62838 Other muscle spasm: Secondary | ICD-10-CM | POA: Insufficient documentation

## 2013-06-10 DIAGNOSIS — Z79899 Other long term (current) drug therapy: Secondary | ICD-10-CM | POA: Insufficient documentation

## 2013-06-10 DIAGNOSIS — F17200 Nicotine dependence, unspecified, uncomplicated: Secondary | ICD-10-CM | POA: Insufficient documentation

## 2013-06-10 DIAGNOSIS — Z791 Long term (current) use of non-steroidal anti-inflammatories (NSAID): Secondary | ICD-10-CM | POA: Insufficient documentation

## 2013-06-10 MED ORDER — METHOCARBAMOL 500 MG PO TABS: 500.0000 mg | ORAL_TABLET | Freq: Two times a day (BID) | ORAL | Status: DC

## 2013-06-10 MED ORDER — METHOCARBAMOL 500 MG PO TABS
500.0000 mg | ORAL_TABLET | Freq: Four times a day (QID) | ORAL | Status: DC
  Administered 2013-06-10: 500 mg via ORAL
  Filled 2013-06-10: qty 1

## 2013-06-10 MED ORDER — IBUPROFEN 800 MG PO TABS: 800.0000 mg | ORAL_TABLET | Freq: Three times a day (TID) | ORAL | Status: DC

## 2013-06-10 MED ORDER — IBUPROFEN 400 MG PO TABS
800.0000 mg | ORAL_TABLET | Freq: Once | ORAL | Status: AC
  Administered 2013-06-10: 800 mg via ORAL
  Filled 2013-06-10: qty 2

## 2013-06-10 NOTE — ED Notes (Signed)
Patient states she used "aleve one time, but it didn't help".  Patient states she had "used heat on it once and it got better for a few minutes".

## 2013-06-10 NOTE — ED Notes (Signed)
Pt reports B/L shoulder pain and neck pain ongoing for some time that has increased today. 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 Pt has an expired IUD at present, does not know when her last menstrual period is.

## 2013-06-10 NOTE — ED Provider Notes (Signed)
CSN: [id number]     Arrival date & time 06/10/13  0815 History   First MD Initiated Contact with Patient 06/10/13 [phone]     Chief Complaint  Patient presents with  . Neck Pain  . Shoulder Pain     (Consider location/radiation/quality/duration/timing/severity/associated sxs/prior Treatment) HPI Comments: Patient is a 28 yo F PMHx significant for asthma presenting to the ED for gradually worsening moderate to severe "tight" neck pain with radiation to right and left shoulder over the last few days. Patient states she has done more heavy lifting lately and thinks this likely exacerbated her pain. She states she has tried Naproxen with little to no improvement. Her pain is aggravated with movement and palpation. Denies fevers, chills, nausea, vomiting, injuries. No history of injury to neck or shoulders. Patient is left handed.   Patient is a 28 y.o. female presenting with neck pain and shoulder pain.  Neck Pain Associated symptoms: no fever   Shoulder Pain Associated symptoms include myalgias and neck pain. Pertinent negatives include no chills or fever.    Past Medical History  Diagnosis Date  . Asthma   . Pneumonia    Past Surgical History  Procedure Laterality Date  . Wrist surgery     Family History  Problem Relation Age of Onset  . Hypertension Mother   . Stroke Mother   . Cancer Maternal Aunt    History  Substance Use Topics  . Smoking status: Current Every Day Smoker  . Smokeless tobacco: Not on file  . Alcohol Use: Yes   OB History   Grav Para Term Preterm Abortions TAB SAB Ect Mult Living                 Review of Systems  Constitutional: Negative for fever and chills.  Respiratory: Negative for shortness of breath.   Musculoskeletal: Positive for myalgias and neck pain.  All other systems reviewed and are negative.      Allergies  Review of patient's allergies indicates no known allergies.  Home Medications   Current Outpatient Rx  Name  Route  Sig   Dispense  Refill  . cephALEXin (KEFLEX) 500 MG capsule   Oral   Take 1 capsule (500 mg total) by mouth 4 (four) times daily.   20 capsule   0   . cephALEXin (KEFLEX) 500 MG capsule   Oral   Take 1 capsule (500 mg total) by mouth 4 (four) times daily.   28 capsule   0   . doxycycline (VIBRAMYCIN) 100 MG capsule   Oral   Take 1 capsule (100 mg total) by mouth 2 (two) times daily.   20 capsule   0   . HYDROcodone-acetaminophen (NORCO) 5-325 MG per tablet   Oral   Take 2 tablets by mouth every 4 (four) hours as needed for pain.   6 tablet   0   . ibuprofen (ADVIL,MOTRIN) 800 MG tablet   Oral   Take 1 tablet (800 mg total) by mouth 3 (three) times daily.   21 tablet   0   . levonorgestrel (MIRENA) 20 MCG/24HR IUD   Intrauterine   1 each by Intrauterine route once.         . methocarbamol (ROBAXIN) 500 MG tablet   Oral   Take 1 tablet (500 mg total) by mouth 2 (two) times daily.   20 tablet   0   . metroNIDAZOLE (FLAGYL) 500 MG tablet   Oral   Take 1 tablet (500  mg total) by mouth 2 (two) times daily.   14 tablet   0   . naproxen sodium (ANAPROX) 220 MG tablet   Oral   Take 220-440 mg by mouth daily as needed (Joint pain).         . ondansetron (ZOFRAN ODT) 8 MG disintegrating tablet   Oral   Take 1 tablet (8 mg total) by mouth every 8 (eight) hours as needed for nausea.   20 tablet   0   . phenazopyridine (PYRIDIUM) 200 MG tablet   Oral   Take 1 tablet (200 mg total) by mouth 3 (three) times daily.   6 tablet   0   . traMADol (ULTRAM) 50 MG tablet   Oral   Take 1 tablet (50 mg total) by mouth every 6 (six) hours as needed for pain.   15 tablet   0    BP 121/65  Pulse 71  Temp(Src) 97.8 F (36.6 C) (Oral)  Resp 18  Ht 5\' 6"  (1.676 m)  Wt 217 lb (98.431 kg)  BMI 35.04 kg/m2  SpO2 100% Physical Exam  Constitutional: She is oriented to person, place, and time. She appears well-developed and well-nourished. No distress.  HENT:  Head:  Normocephalic and atraumatic.  Right Ear: External ear normal.  Left Ear: External ear normal.  Nose: Nose normal.  Mouth/Throat: Oropharynx is clear and moist. No oropharyngeal exudate.  Eyes: Conjunctivae and EOM are normal. Pupils are equal, round, and reactive to light.  Neck: Normal range of motion. Neck supple. Muscular tenderness present. No spinous process tenderness present. No rigidity. No edema, no erythema and normal range of motion present.  Cardiovascular: Normal rate, regular rhythm, normal heart sounds and intact distal pulses.   Pulmonary/Chest: Effort normal and breath sounds normal. No respiratory distress.  Abdominal: Soft. There is no tenderness.  Musculoskeletal:       Right shoulder: She exhibits tenderness. She exhibits normal range of motion, no bony tenderness, no swelling, no effusion, no deformity, normal pulse and normal strength.       Left shoulder: She exhibits tenderness and spasm. She exhibits normal range of motion, no bony tenderness, no swelling, no effusion, no deformity, no laceration, normal pulse and normal strength.       Cervical back: She exhibits tenderness and spasm. She exhibits normal range of motion, no bony tenderness, no swelling, no edema, no deformity and no laceration.       Back:  Neurological: She is alert and oriented to person, place, and time. She has normal strength. No cranial nerve deficit or sensory deficit. Gait normal. GCS eye subscore is 4. GCS verbal subscore is 5. GCS motor subscore is 6.  No pronator drift. Bilateral heel-knee-shin intact.  Skin: Skin is warm and dry. She is not diaphoretic.    ED Course  Procedures (including critical care time) Medications  methocarbamol (ROBAXIN) tablet 500 mg (500 mg Oral Given 06/10/13 0917)  ibuprofen (ADVIL,MOTRIN) tablet 800 mg (800 mg Oral Given 06/10/13 0917)    Labs Review Labs Reviewed - No data to display Imaging Review No results found.   EKG Interpretation None       MDM   Final diagnoses:  Muscle spasms of neck    Filed Vitals:   06/10/13 0828  BP: 121/65  Pulse: 71  Temp: 97.8 F (36.6 C)  Resp: 18    Afebrile, NAD, non-toxic appearing, AAOx4. No posterior midline tenderness. No neurological deficits and normal neuro exam.  Patient has  full ROM of cervical spine. Neurovascularly intact. Normal sensation. No fever, night sweats, weight loss, h/o cancer, IVDU.  RICE protocol and pain medicine indicated and discussed with patient. Patient is agreeable to plan. Patient is stable at time of discharge        Jeannetta Ellis, PA-C 06/10/13 1610

## 2013-06-10 NOTE — Discharge Instructions (Signed)
Please follow up with your primary care physician in 1-2 days. If you do not have one please [facility] listed above. Please take pain medication and/or muscle relaxants as prescribed and as needed for pain. Please do not drive on narcotic pain medication or on muscle relaxants. Please take motrin as prescribed. Please read all discharge instructions and return precautions.    Muscle Cramps and Spasms Muscle cramps and spasms occur when a muscle or muscles tighten and you have no control over this tightening (involuntary muscle contraction). They are a common problem and can develop in any muscle. The most common place is in the calf muscles of the leg. Both muscle cramps and muscle spasms are involuntary muscle contractions, but they also have differences:   Muscle cramps are sporadic and painful. They may last a few seconds to a quarter of an hour. Muscle cramps are often more forceful and last longer than muscle spasms.  Muscle spasms may or may not be painful. They may also last just a few seconds or much longer. CAUSES  It is uncommon for cramps or spasms to be due to a serious underlying problem. In many cases, the cause of cramps or spasms is unknown. Some common causes are:   Overexertion.   Overuse from repetitive motions (doing the same thing over and over).   Remaining in a certain position for a long period of time.   Improper preparation, form, or technique while performing a sport or activity.   Dehydration.   Injury.   Side effects of some medicines.   Abnormally low levels of the salts and ions in your blood (electrolytes), especially potassium and calcium. This could happen if you are taking water pills (diuretics) or you are pregnant.  Some underlying medical problems can make it more likely to develop cramps or spasms. These include, but are not limited to:   Diabetes.   Parkinson disease.   Hormone disorders, such as  thyroid problems.   Alcohol abuse.   Diseases specific to muscles, joints, and bones.   Blood vessel disease where not enough blood is getting to the muscles.  HOME CARE INSTRUCTIONS   Stay well hydrated. Drink enough water and fluids to keep your urine clear or pale yellow.  It may be helpful to massage, stretch, and relax the affected muscle.  For tight or tense muscles, use a warm towel, heating pad, or hot shower water directed to the affected area.  If you are sore or have pain after a cramp or spasm, applying ice to the affected area may relieve discomfort.  Put ice in a plastic bag.  Place a towel between your skin and the bag.  Leave the ice on for 15-20 minutes, 03-04 times a day.  Medicines used to treat a known cause of cramps or spasms may help reduce their frequency or severity. Only take over-the-counter or prescription medicines as directed by your caregiver. SEEK MEDICAL CARE IF:  Your cramps or spasms get more severe, more frequent, or do not improve over time.  MAKE SURE YOU:   Understand these instructions.  Will watch your condition.  Will get help right away if you are not doing well or get worse. Document Released: 09/06/2001 Document Revised: 07/12/2012 Document Reviewed: 03/03/2012 [facility] Patient Information 2014 [place], [place].

## 2013-06-13 NOTE — ED Provider Notes (Signed)
Medical screening examination/treatment/procedure(s) were performed by non-physician practitioner and as supervising physician I was immediately available for consultation/collaboration.   Beaux Wedemeyer T Danilynn Jemison, MD 06/13/13 0740 

## 2013-10-10 ENCOUNTER — Emergency Department (HOSPITAL_COMMUNITY)
Admission: EM | Admit: 2013-10-10 | Discharge: 2013-10-10 | Disposition: A | Discharge status: Home / Self Care | Payer: Medicaid Other | Attending: Emergency Medicine | Admitting: Emergency Medicine

## 2013-10-10 ENCOUNTER — Emergency Department (HOSPITAL_COMMUNITY): Payer: Medicaid Other

## 2013-10-10 ENCOUNTER — Encounter (HOSPITAL_COMMUNITY): Payer: Self-pay | Admitting: Emergency Medicine

## 2013-10-10 DIAGNOSIS — R071 Chest pain on breathing: Secondary | ICD-10-CM | POA: Diagnosis not present

## 2013-10-10 DIAGNOSIS — F17200 Nicotine dependence, unspecified, uncomplicated: Secondary | ICD-10-CM | POA: Insufficient documentation

## 2013-10-10 DIAGNOSIS — J45909 Unspecified asthma, uncomplicated: Secondary | ICD-10-CM | POA: Insufficient documentation

## 2013-10-10 DIAGNOSIS — R079 Chest pain, unspecified: Secondary | ICD-10-CM | POA: Diagnosis present

## 2013-10-10 DIAGNOSIS — Z791 Long term (current) use of non-steroidal anti-inflammatories (NSAID): Secondary | ICD-10-CM | POA: Insufficient documentation

## 2013-10-10 DIAGNOSIS — Z8701 Personal history of pneumonia (recurrent): Secondary | ICD-10-CM | POA: Diagnosis not present

## 2013-10-10 LAB — CBC
HCT: 35.4 % — ABNORMAL LOW (ref 36.0–46.0)
Hemoglobin: 11.9 g/dL — ABNORMAL LOW (ref 12.0–15.0)
MCH: 26.9 pg (ref 26.0–34.0)
MCHC: 33.6 g/dL (ref 30.0–36.0)
MCV: 80.1 fL (ref 78.0–100.0)
PLATELETS: 209 10*3/uL (ref 150–400)
RBC: 4.42 MIL/uL (ref 3.87–5.11)
RDW: 14.1 % (ref 11.5–15.5)
WBC: 9.4 10*3/uL (ref 4.0–10.5)

## 2013-10-10 LAB — BASIC METABOLIC PANEL
ANION GAP: 11 (ref 5–15)
BUN: 12 mg/dL (ref 6–23)
CALCIUM: 9.5 mg/dL (ref 8.4–10.5)
CHLORIDE: 103 meq/L (ref 96–112)
CO2: 25 mEq/L (ref 19–32)
Creatinine, Ser: 0.91 mg/dL (ref 0.50–1.10)
GFR calc non Af Amer: 86 mL/min — ABNORMAL LOW (ref >90)
Glucose, Bld: 92 mg/dL (ref 70–99)
Potassium: 4.3 mEq/L (ref 3.7–5.3)
Sodium: 139 mEq/L (ref 137–147)

## 2013-10-10 LAB — I-STAT TROPONIN, ED: Troponin i, poc: 0 ng/mL (ref 0.00–0.08)

## 2013-10-10 MED ORDER — PREDNISONE 20 MG PO TABS: 40.0000 mg | ORAL_TABLET | Freq: Every day | ORAL | Status: DC

## 2013-10-10 NOTE — ED Notes (Signed)
Patient states had a cough/cold prior to having chest pains which started last Wednesday.   Patient states she has pain in her central chest when she moves her arms around.  Denies any other symptoms.

## 2013-10-10 NOTE — ED Notes (Signed)
Cp since  midsternal since last wed has had this pain before but this time it stay hurts to move and touch cough and breath she states , has some catches in her rib cage and it makes it hard to catch her breath

## 2013-10-10 NOTE — ED Notes (Signed)
Pt c/o centralized chest pain with bilateral arm pain.  Pt reports similar episodes in the past .Reports lying in bed last night and felt like her chest needed to "pop like popping your knuckles." States usually she can stand up and hear a popping sound in chest that will relieve pressure; unable to relieve pressure this time.  Pt unable to lie on side due to pressure in chest; pain increases when pressure applied externally to chest.

## 2013-10-10 NOTE — Discharge Instructions (Signed)
Take the prescribed medication as directed.  May continue taking naprosyn as well. Follow-up with your primary care physician. Return to the ED for new or worsening symptoms.

## 2013-10-10 NOTE — ED Notes (Signed)
Lisa, PA at bedside

## 2013-10-10 NOTE — ED Provider Notes (Signed)
CSN: [id number]     Arrival date & time 10/10/13  4098 History   First MD Initiated Contact with Patient 10/10/13 [phone]     Chief Complaint  Patient presents with  . Chest Pain  . Cough     (Consider location/radiation/quality/duration/timing/severity/associated sxs/prior Treatment) The history is provided by the patient and medical records.   This is a 28 year old female with past medical history significant for asthma, presenting to the ED for chest pain. Patient states last week she was experiencing URI-type symptoms, specifically she had productive cough, postnasal drip, sinus pressure, sore throat, and generalized body aches. States that his cough has improved, however since then she has developed some pain in her chest. She describes it as "a sensation that her chest need to pop like when you pop your knuckles".  She states there is a generalized ache, and made worse with moving either of her arms, bending forward, twisting, coughing, and deep breathing. She denies any shortness of breath, palpitations, dizziness, or weakness.  No fever or chills.  Patient has no prior cardiac history. She is a daily smoker, approximately 5 cigarettes daily. States the only thing that has relieved her pain thus far is smoking marijuana. No relief with naprosyn.  Vital signs stable on arrival.  Past Medical History  Diagnosis Date  . Asthma   . Pneumonia    Past Surgical History  Procedure Laterality Date  . Wrist surgery     Family History  Problem Relation Age of Onset  . Hypertension Mother   . Stroke Mother   . Cancer Maternal Aunt    History  Substance Use Topics  . Smoking status: Current Every Day Smoker -- 0.50 packs/day    Types: Cigarettes  . Smokeless tobacco: Not on file  . Alcohol Use: Yes   OB History   Grav Para Term Preterm Abortions TAB SAB Ect Mult Living                 Review of Systems  Cardiovascular: Positive for chest pain.  All other systems reviewed and are  negative.     Allergies  Review of patient's allergies indicates no known allergies.  Home Medications   Prior to Admission medications   Medication Sig Start Date End Date Taking? Authorizing Provider  naproxen sodium (ANAPROX) 220 MG tablet Take 220-440 mg by mouth daily as needed (Joint pain).    Historical Provider, MD   BP 127/70  Pulse 79  Temp(Src) 98 F (36.7 C) (Oral)  Resp 18  Ht 5\' 6"  (1.676 m)  Wt 217 lb (98.431 kg)  BMI 35.04 kg/m2  SpO2 99%  Physical Exam  Nursing note and vitals reviewed. Constitutional: She is oriented to person, place, and time. She appears well-developed and well-nourished. No distress.  HENT:  Head: Normocephalic and atraumatic.  Mouth/Throat: Oropharynx is clear and moist.  Eyes: Conjunctivae and EOM are normal. Pupils are equal, round, and reactive to light.  Neck: Normal range of motion. Neck supple.  Cardiovascular: Normal rate, regular rhythm and normal heart sounds.   Chest pain reproducible with palpation to anterior chest wall, movement of bilateral arms, leaning forward, and changing position  Pulmonary/Chest: Effort normal and breath sounds normal. No respiratory distress. She has no wheezes.  Abdominal: Soft. Bowel sounds are normal. There is no tenderness. There is no guarding.  Musculoskeletal: Normal range of motion. She exhibits no edema.  Neurological: She is alert and oriented to person, place, and time.  Skin: Skin is  warm and dry. She is not diaphoretic.  Psychiatric: She has a normal mood and affect.    ED Course  Procedures (including critical care time) Labs Review Labs Reviewed  CBC - Abnormal; Notable for the following:    Hemoglobin 11.9 (*)    HCT 35.4 (*)    All other components within normal limits  BASIC METABOLIC PANEL - Abnormal; Notable for the following:    GFR calc non Af Amer 86 (*)    All other components within normal limits  I-STAT TROPOININ, ED    Imaging Review Dg Chest 2  View  10/10/2013   CLINICAL DATA:  Chest pain and cough.  EXAM: CHEST  2 VIEW  COMPARISON:  12/12/2012  FINDINGS: The heart size and mediastinal contours are within normal limits. Both lungs are clear. The visualized skeletal structures are unremarkable.  IMPRESSION: Normal chest.   Electronically Signed   By: Geanie CooleyJim  Maxwell M.D.   On: 10/10/2013 07:42     EKG Interpretation None      MDM   Final diagnoses:  Chest pain, unspecified chest pain type   28 y.o. F with no known cardiac hx, presenting with chest pain occuring after 1 week of URI sx.  On exam, patient is afebrile and overall nontoxic appearing.  Her respirations are unlabored. Pain is reproducible with palpation to mid sternal region and movement of bilateral arms.  She has no tachycardia, hypoxia, her risk factors for PE. She is PERC negative.  EKG sinus rhythm without acute ischemic changes. Work-up today including labs and CXR are unremarkable.  At this time i have low suspicion for ACS, PE, dissection, or other acute cardiac event.  Pain seems pleuritic in nature.  Pt will be discharged home with prednisone, may continue anti-inflammatories.  She will FU with PCP.  Discussed plan with patient, he/she acknowledged understanding and agreed with plan of care.  Return precautions given for new or worsening symptoms.  Garlon HatchetLisa M Maryori Weide, PA-C 10/10/13 1329

## 2013-10-18 NOTE — ED Provider Notes (Signed)
Medical screening examination/treatment/procedure(s) were performed by non-physician practitioner and as supervising physician I was immediately available for consultation/collaboration.   EKG Interpretation   Date/Time:  Monday October 10 2013 07:18:37 EDT Ventricular Rate:  88 PR Interval:  136 QRS Duration: 76 QT Interval:  350 QTC Calculation: 423 R Axis:   82 Text Interpretation:  Normal sinus rhythm Septal infarct , age  undetermined Abnormal ECG ED PHYSICIAN INTERPRETATION AVAILABLE IN [facility] Confirmed by TEST, Record ([id number]) on 10/12/2013 12:19:54 PM        Rolland PorterMark Armoni Kludt, MD 10/18/13 [phone]

## 2014-01-23 ENCOUNTER — Emergency Department (HOSPITAL_COMMUNITY)
Admission: EM | Admit: 2014-01-23 | Discharge: 2014-01-23 | Discharge status: Against Medical Advice | Payer: Medicaid Other

## 2014-01-30 ENCOUNTER — Encounter (HOSPITAL_COMMUNITY): Payer: Self-pay | Admitting: Emergency Medicine

## 2014-01-30 ENCOUNTER — Emergency Department (HOSPITAL_COMMUNITY)
Admission: EM | Admit: 2014-01-30 | Discharge: 2014-01-30 | Disposition: A | Discharge status: Home / Self Care | Payer: Medicaid Other | Attending: Emergency Medicine | Admitting: Emergency Medicine

## 2014-01-30 ENCOUNTER — Emergency Department (HOSPITAL_COMMUNITY): Payer: Medicaid Other

## 2014-01-30 DIAGNOSIS — Z7952 Long term (current) use of systemic steroids: Secondary | ICD-10-CM | POA: Diagnosis not present

## 2014-01-30 DIAGNOSIS — E669 Obesity, unspecified: Secondary | ICD-10-CM | POA: Diagnosis not present

## 2014-01-30 DIAGNOSIS — Z8701 Personal history of pneumonia (recurrent): Secondary | ICD-10-CM | POA: Insufficient documentation

## 2014-01-30 DIAGNOSIS — R52 Pain, unspecified: Secondary | ICD-10-CM

## 2014-01-30 DIAGNOSIS — T1490XA Injury, unspecified, initial encounter: Secondary | ICD-10-CM

## 2014-01-30 DIAGNOSIS — J45909 Unspecified asthma, uncomplicated: Secondary | ICD-10-CM | POA: Diagnosis not present

## 2014-01-30 DIAGNOSIS — Z720 Tobacco use: Secondary | ICD-10-CM | POA: Diagnosis not present

## 2014-01-30 DIAGNOSIS — S66912A Strain of unspecified muscle, fascia and tendon at wrist and hand level, left hand, initial encounter: Secondary | ICD-10-CM | POA: Insufficient documentation

## 2014-01-30 DIAGNOSIS — S6992XA Unspecified injury of left wrist, hand and finger(s), initial encounter: Secondary | ICD-10-CM | POA: Diagnosis present

## 2014-01-30 HISTORY — DX: Obesity, unspecified: E66.9

## 2014-01-30 MED ORDER — MELOXICAM 7.5 MG PO TABS: 15.0000 mg | ORAL_TABLET | Freq: Every day | ORAL | Status: DC
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
## 2014-01-30 NOTE — ED Notes (Signed)
Pt A&OX4, ambulatory at d/c with steady gait, NAD 

## 2014-01-30 NOTE — Discharge Instructions (Signed)
Wrist Sprain with Rehab A sprain is an injury in which a ligament that maintains the proper alignment of a joint is partially or completely torn. The ligaments of the wrist are susceptible to sprains. Sprains are classified into three categories. Grade 1 sprains cause pain, but the tendon is not lengthened. Grade 2 sprains include a lengthened ligament because the ligament is stretched or partially ruptured. With grade 2 sprains there is still function, although the function may be diminished. Grade 3 sprains are characterized by a complete tear of the tendon or muscle, and function is usually impaired. SYMPTOMS   Pain tenderness, inflammation, and/or bruising (contusion) of the injury.  A "pop" or tear felt and/or heard at the time of injury.  Decreased wrist function. CAUSES  A wrist sprain occurs when a force is placed on one or more ligaments that is greater than it/they can withstand. Common mechanisms of injury include:  Catching a ball with you hands.  Repetitive and/ or strenuous extension or flexion of the wrist. RISK INCREASES WITH:  Previous wrist injury.  Contact sports (boxing or wrestling).  Activities in which falling is common.  Poor strength and flexibility.  Improperly fitted or padded protective equipment. PREVENTION  Warm up and stretch properly before activity.  Allow for adequate recovery between workouts.  Maintain physical fitness:  Strength, flexibility, and endurance.  Cardiovascular fitness.  Protect the wrist joint by limiting its motion with the use of taping, braces, or splints.  Protect the wrist after injury for 6 to 12 months. PROGNOSIS  The prognosis for wrist sprains depends on the degree of injury. Grade 1 sprains require 2 to 6 weeks of treatment. Grade 2 sprains require 6 to 8 weeks of treatment, and grade 3 sprains require up to 12 weeks.  RELATED COMPLICATIONS   Prolonged healing time, if improperly treated or  re-injured.  Recurrent symptoms that result in a chronic problem.  Injury to nearby structures (bone, cartilage, nerves, or tendons).  Arthritis of the wrist.  Inability to compete in athletics at a high level.  Wrist stiffness or weakness.  Progression to a complete rupture of the ligament. TREATMENT  Treatment initially involves resting from any activities that aggravate the symptoms, and the use of ice and medications to help reduce pain and inflammation. Your caregiver may recommend immobilizing the wrist for a period of time in order to reduce stress on the ligament and allow for healing. After immobilization it is important to perform strengthening and stretching exercises to help regain strength and a full range of motion. These exercises may be completed at home or with a therapist. Surgery is not usually required for wrist sprains, unless the ligament has been ruptured (grade 3 sprain). MEDICATION   If pain medication is necessary, then nonsteroidal anti-inflammatory medications, such as aspirin and ibuprofen, or other minor pain relievers, such as acetaminophen, are often recommended.  Do not take pain medication for 7 days before surgery.  Prescription pain relievers may be given if deemed necessary by your caregiver. Use only as directed and only as much as you need. HEAT AND COLD  Cold treatment (icing) relieves pain and reduces inflammation. Cold treatment should be applied for 10 to 15 minutes every 2 to 3 hours for inflammation and pain and immediately after any activity that aggravates your symptoms. Use ice packs or massage the area with a piece of ice (ice massage).  Heat treatment may be used prior to performing the stretching and strengthening activities prescribed by your  caregiver, physical therapist, or athletic trainer. Use a heat pack or soak your injury in warm water. SEEK MEDICAL CARE IF:  Treatment seems to offer no benefit, or the condition worsens.  Any  medications produce adverse side effects. RICE: Routine Care for Injuries Rest, Ice, Compression, and Elevation (RICE) are often used to care for injuries. HOME CARE  Rest your injury.  Put ice on the injury.  Put ice in a plastic bag.  Place a towel between your skin and the bag.  Leave the ice on for 15-20 minutes, 03-04 times a day. Do this for as long as told by your doctor.  Apply pressure (compression) with an elastic bandage. Remove and reapply the bandage every 3 to 4 hours. Do not wrap the bandage too tight. Wrap the bandage looser if the fingers or toes are puffy (swollen), blue, cold, painful, or lose feeling (numb).  Raise (elevate) your injury. Raise your injury above the heart if you can. GET HELP RIGHT AWAY IF:  You have lasting pain or puffiness.  Your injury is red, weak, or loses feeling.  Your problems get worse, not better, after several days. MAKE SURE YOU:  Understand these instructions.  Will watch your condition.  Will get help right away if you are not doing well or get worse. Document Released: 09/03/2007 Document Revised: 06/09/2011 Document Reviewed: 08/16/2010 [facility] Patient Information 2015 [employer] [place]. This information is not intended to replace advice given to you by your health care provider. Make sure you discuss any questions you have with your health care provider.

## 2014-11-19 ENCOUNTER — Encounter (HOSPITAL_COMMUNITY): Payer: Self-pay | Admitting: Emergency Medicine

## 2014-11-19 ENCOUNTER — Emergency Department (HOSPITAL_COMMUNITY)
Admission: EM | Admit: 2014-11-19 | Discharge: 2014-11-19 | Disposition: A | Discharge status: Home / Self Care | Payer: Medicaid Other | Attending: Emergency Medicine | Admitting: Emergency Medicine

## 2014-11-19 DIAGNOSIS — E669 Obesity, unspecified: Secondary | ICD-10-CM | POA: Insufficient documentation

## 2014-11-19 DIAGNOSIS — J45909 Unspecified asthma, uncomplicated: Secondary | ICD-10-CM | POA: Insufficient documentation

## 2014-11-19 DIAGNOSIS — M545 Low back pain: Secondary | ICD-10-CM | POA: Diagnosis not present

## 2014-11-19 DIAGNOSIS — Z720 Tobacco use: Secondary | ICD-10-CM | POA: Insufficient documentation

## 2014-11-19 DIAGNOSIS — Z8701 Personal history of pneumonia (recurrent): Secondary | ICD-10-CM | POA: Insufficient documentation

## 2014-11-19 DIAGNOSIS — G8929 Other chronic pain: Secondary | ICD-10-CM | POA: Diagnosis not present

## 2014-11-19 DIAGNOSIS — Z791 Long term (current) use of non-steroidal anti-inflammatories (NSAID): Secondary | ICD-10-CM | POA: Diagnosis not present

## 2014-11-19 DIAGNOSIS — M2550 Pain in unspecified joint: Secondary | ICD-10-CM | POA: Insufficient documentation

## 2014-11-19 MED ORDER — NAPROXEN 500 MG PO TABS: 500.0000 mg | ORAL_TABLET | Freq: Two times a day (BID) | ORAL | Status: DC

## 2014-11-19 MED ORDER — TRAMADOL HCL 50 MG PO TABS: 50.0000 mg | ORAL_TABLET | Freq: Four times a day (QID) | ORAL | Status: DC | PRN

## 2014-11-19 NOTE — ED Provider Notes (Signed)
CSN: [id number]     Arrival date & time 11/19/14  0757 History   First MD Initiated Contact with Patient 11/19/14 0831     Chief Complaint  Patient presents with  . Joint Pain     (Consider location/radiation/quality/duration/timing/severity/associated sxs/prior Treatment) HPI Comments: Patients with history of chronic joint pain of unknown etiology, previously saw a physician while living in [place] and was told that she needed to see a rheumatologist. Since moving to [place], she has been unable to see a primary care physician or a rheumatologist due to Medicaid problems. Patient presents with acute on chronic pain in her bilateral hands as well as her right lateral knee making it hard for her to walk. She states that when her pain is worse, she has difficulty grasping objects. She denies fever, nausea or vomiting. She typically tries to ignore the pain and not take any medications for this and has not taken any medications recently. She does use marijuana which helps her pain. Patient denies other medical complaints.  The history is provided by the patient and medical records.    Past Medical History  Diagnosis Date  . Asthma   . Pneumonia   . Obesity    Past Surgical History  Procedure Laterality Date  . Wrist surgery    . Left wrist fracture     Family History  Problem Relation Age of Onset  . Hypertension Mother   . Stroke Mother   . Cancer Maternal Aunt    Social History  Substance Use Topics  . Smoking status: Current Every Day Smoker -- 0.50 packs/day    Types: Cigarettes  . Smokeless tobacco: None  . Alcohol Use: 0.6 oz/week    1 Shots of liquor per week   OB History    No data available     Review of Systems  Constitutional: Negative for fever.  Cardiovascular: Negative for leg swelling.  Gastrointestinal: Negative for nausea and vomiting.  Genitourinary: Negative for pelvic pain (IUD in place, pt states it was supposed to be taken out 4 yrs ago).   Musculoskeletal: Positive for back pain (chronic) and arthralgias. Negative for myalgias.      Allergies  Review of patient's allergies indicates no known allergies.  Home Medications   Prior to Admission medications   Medication Sig Start Date End Date Taking? Authorizing Provider  meloxicam (MOBIC) 7.5 MG tablet Take 2 tablets (15 mg total) by mouth daily. 01/30/14   Antony Madura, PA-C  naproxen sodium (ANAPROX) 220 MG tablet Take 220-440 mg by mouth daily as needed (Joint pain).    Historical Provider, MD  predniSONE (DELTASONE) 20 MG tablet Take 2 tablets (40 mg total) by mouth daily. Take 40 mg by mouth daily for 3 days, then  by mouth daily for 3 days, then  daily for 3 days 10/10/13   Garlon Hatchet, PA-C   BP 113/66 mmHg  Pulse 86  Temp(Src) 98.3 F (36.8 C) (Oral)  Resp 18  Ht  (1.676 m)  SpO2 100%  LMP   Physical Exam  Constitutional: She appears well-developed and well-nourished.  HENT:  Head: Normocephalic and atraumatic.  Eyes: Conjunctivae are normal. Right eye exhibits no discharge. Left eye exhibits no discharge.  Neck: Normal range of motion. Neck supple.  Cardiovascular: Normal rate, regular rhythm and normal heart sounds.   Pulmonary/Chest: Effort normal and breath sounds normal.  Abdominal: Soft. There is no tenderness.  Musculoskeletal: She exhibits tenderness.  Full range of motion in  all joints of upper and lower extremities. No joint effusions or swelling noted. Patient reports some mild tenderness with flexion of her MCP, DIP and PIP joints bilaterally. She also reports some tenderness with palpation over the joint line of the right lateral knee. Again no joint effusion, redness, or swelling.  Neurological: She is alert.  Skin: Skin is warm and dry.  Psychiatric: She has a normal mood and affect.  Nursing note and vitals reviewed.   ED Course  Procedures (including critical care time)   8:48 AM Patient seen and examined.   Vital  signs reviewed and are as follows: BP 113/66 mmHg  Pulse 86  Temp(Src) 98.3 F (36.8 C) (Oral)  Resp 18  Ht 5\' 6"  (1.676 m)  SpO2 100%  LMP   Patient will be discharged to home on naproxen and tramadol. Health and wellness follow-up given as well as [facility] outpatient clinic follow-up.  Encouraged to return with redness and swelling of joints, fever. Patient verbalizes understanding and agrees with plan.    MDM   Final diagnoses:  Arthralgia   Patient with acute on chronic joint pain. Etiology is unclear. Patient needs appropriate outpatient workup for this. Referrals given as above. No concerns regarding complication with IUD.    Renne Crigler, PA-C 11/19/14 0981  Pricilla Loveless, MD 11/19/14 1010

## 2014-11-19 NOTE — ED Notes (Signed)
Pt reports right lateral knee pain and bilat hand pain, specific thumb area, for "years" with worsening past week.

## 2014-11-19 NOTE — Discharge Instructions (Signed)
Please read and follow all provided instructions.  Your diagnoses today include:  1. Arthralgia    Tests performed today include:  Vital signs. See below for your results today.   Medications prescribed:   Naproxen - anti-inflammatory pain medication  Do not exceed  naproxen every 12 hours, take with food  You have been prescribed an anti-inflammatory medication or NSAID. Take with food. Take smallest effective dose for the shortest duration needed for your pain. Stop taking if you experience stomach pain or vomiting.    Tramadol - narcotic-like pain medication  DO NOT drive or perform any activities that require you to be awake and alert because this medicine can make you drowsy.   Take any prescribed medications only as directed.  Home care instructions:  Follow any educational materials contained in this packet.  BE VERY CAREFUL not to take multiple medicines containing Tylenol (also called acetaminophen). Doing so can lead to an overdose which can damage your liver and cause liver failure and possibly death.   Follow-up instructions: Please follow-up with your primary care provider in the next 7 days for further evaluation of your symptoms.   Return instructions:   Please return to the Emergency Department if you experience worsening symptoms.   Return with fever, joint swelling, or redness.  Please return if you have any other emergent concerns.  Additional Information:  Your vital signs today were: BP 113/66 mmHg   Pulse 86   Temp(Src) 98.3 F (36.8 C) (Oral)   Resp 18   Ht  (1.676 m)   SpO2 100%   LMP  If your blood pressure (BP) was elevated above 135/85 this visit, please have this repeated by your doctor within one month. --------------

## 2014-12-08 IMAGING — CR DG CHEST 2V
2 series · 2 of 2 positions shown · non-contrast
Comparison: 12/12/2012

CLINICAL DATA: Chest pain and cough.

EXAM:
CHEST  2 VIEW

[w chest pa]
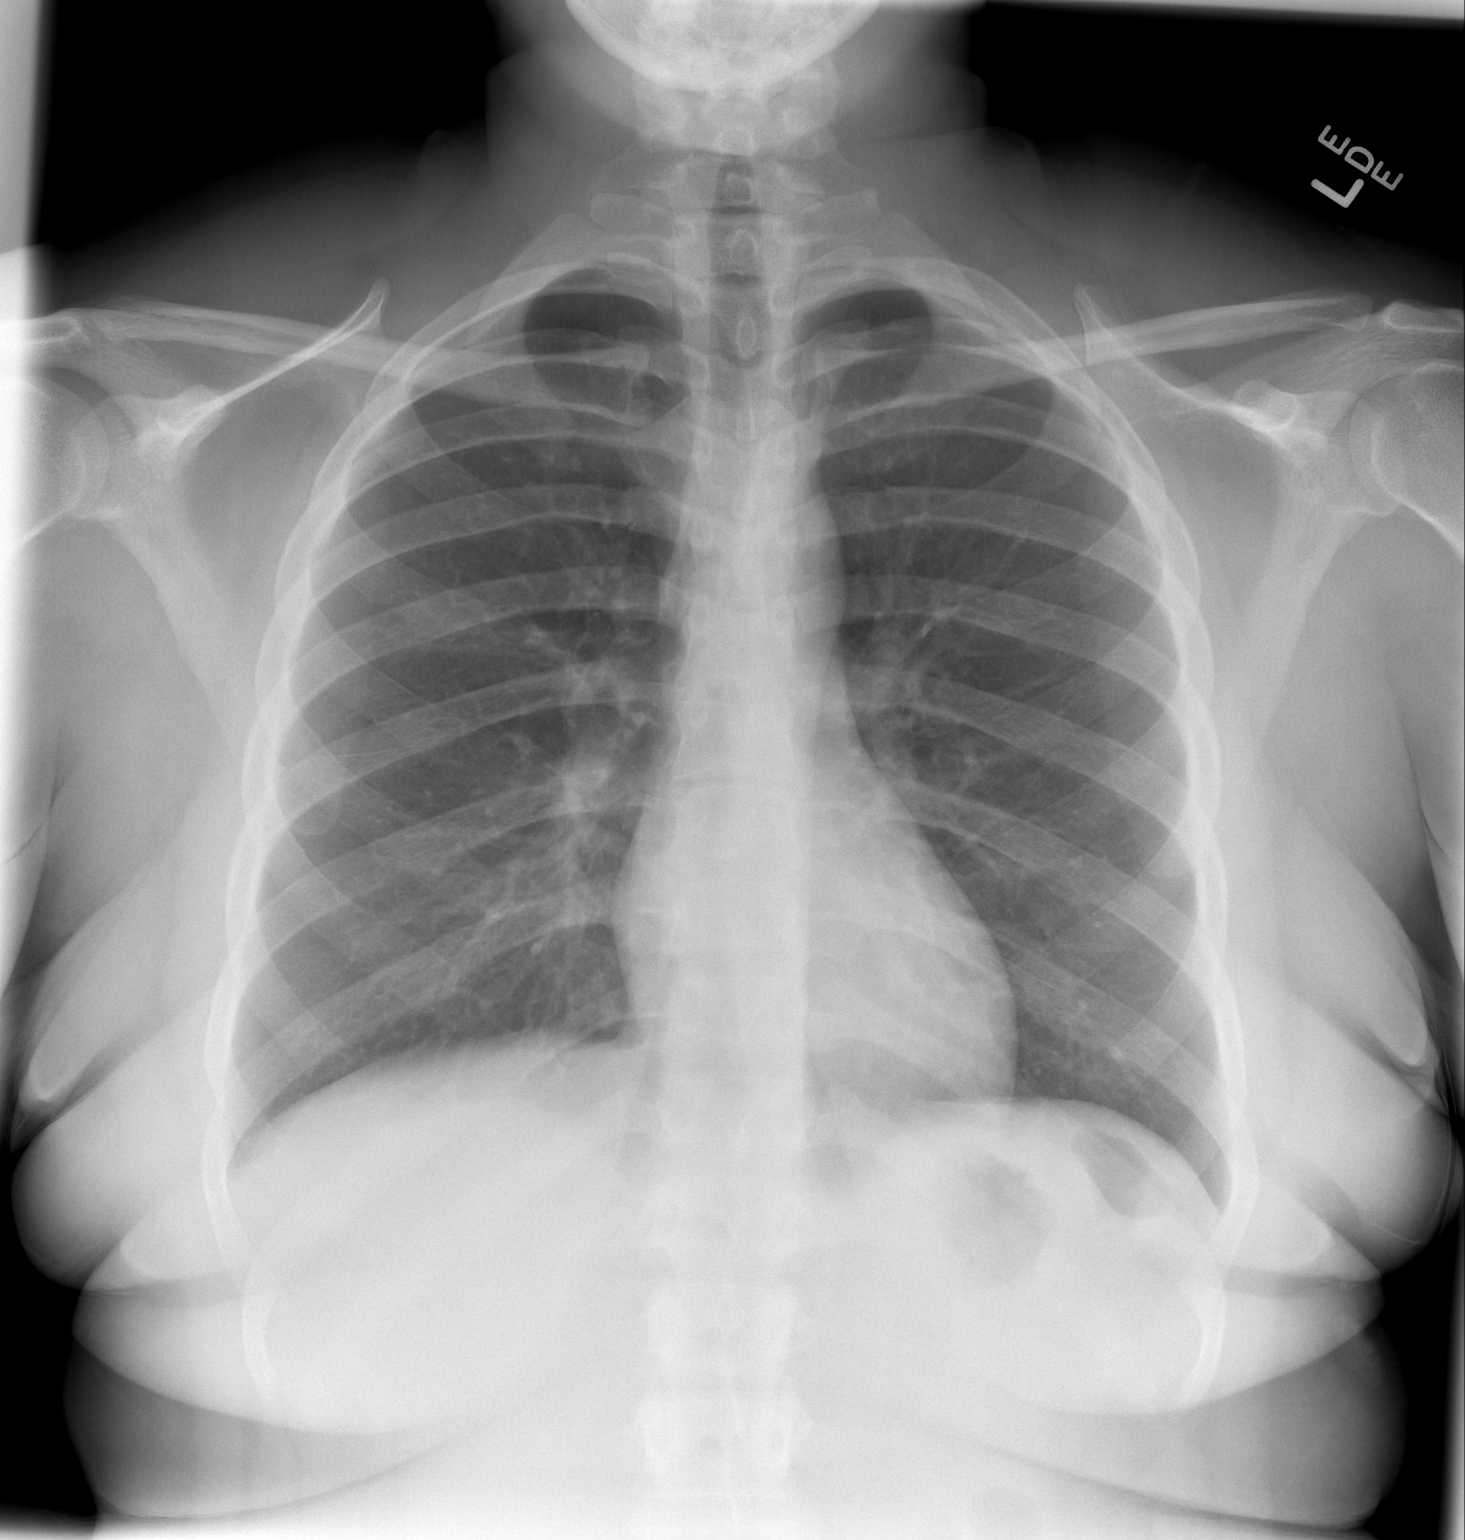

[w chest lat]
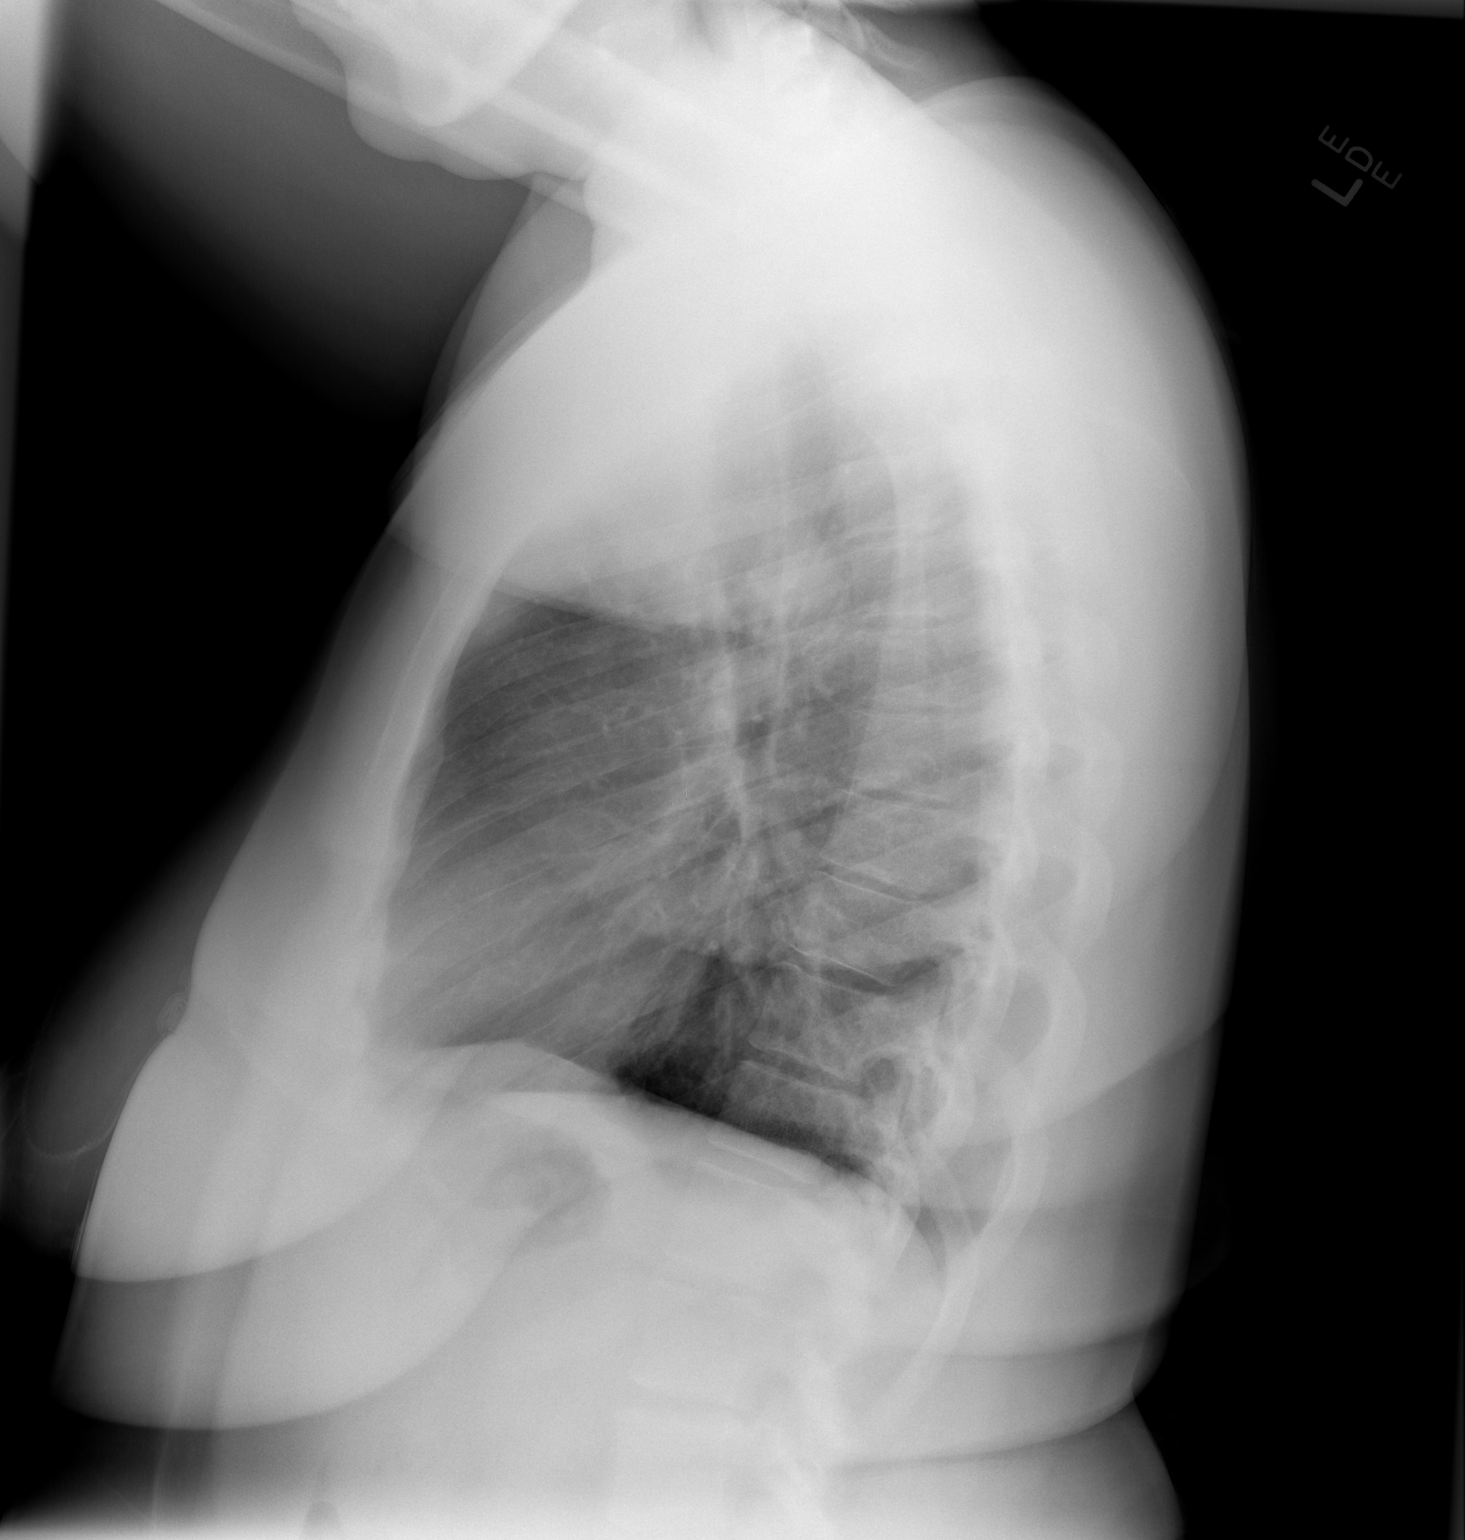

[2 of 2 positions shown; findings below may reference images not displayed]

FINDINGS: The heart size and mediastinal contours are within normal limits.
Both lungs are clear. The visualized skeletal structures are
unremarkable.
IMPRESSION: Normal chest.

## 2015-01-11 ENCOUNTER — Encounter (HOSPITAL_COMMUNITY): Payer: Self-pay | Admitting: Emergency Medicine

## 2015-01-11 ENCOUNTER — Emergency Department (HOSPITAL_COMMUNITY)
Admission: EM | Admit: 2015-01-11 | Discharge: 2015-01-11 | Disposition: A | Discharge status: Home / Self Care | Payer: Medicaid Other | Attending: Emergency Medicine | Admitting: Emergency Medicine

## 2015-01-11 ENCOUNTER — Emergency Department (HOSPITAL_COMMUNITY): Payer: Medicaid Other

## 2015-01-11 DIAGNOSIS — M25552 Pain in left hip: Secondary | ICD-10-CM | POA: Diagnosis not present

## 2015-01-11 DIAGNOSIS — E669 Obesity, unspecified: Secondary | ICD-10-CM | POA: Insufficient documentation

## 2015-01-11 DIAGNOSIS — Z3202 Encounter for pregnancy test, result negative: Secondary | ICD-10-CM | POA: Diagnosis not present

## 2015-01-11 DIAGNOSIS — Z79899 Other long term (current) drug therapy: Secondary | ICD-10-CM | POA: Insufficient documentation

## 2015-01-11 DIAGNOSIS — M5440 Lumbago with sciatica, unspecified side: Secondary | ICD-10-CM | POA: Insufficient documentation

## 2015-01-11 DIAGNOSIS — Z8701 Personal history of pneumonia (recurrent): Secondary | ICD-10-CM | POA: Diagnosis not present

## 2015-01-11 DIAGNOSIS — J45909 Unspecified asthma, uncomplicated: Secondary | ICD-10-CM | POA: Insufficient documentation

## 2015-01-11 DIAGNOSIS — Z720 Tobacco use: Secondary | ICD-10-CM | POA: Insufficient documentation

## 2015-01-11 DIAGNOSIS — M545 Low back pain: Secondary | ICD-10-CM

## 2015-01-11 DIAGNOSIS — M25559 Pain in unspecified hip: Secondary | ICD-10-CM

## 2015-01-11 LAB — URINALYSIS, ROUTINE W REFLEX MICROSCOPIC
BILIRUBIN URINE: NEGATIVE
Glucose, UA: NEGATIVE mg/dL
Hgb urine dipstick: NEGATIVE
Ketones, ur: NEGATIVE mg/dL
Leukocytes, UA: NEGATIVE
Nitrite: NEGATIVE
Protein, ur: NEGATIVE mg/dL
Specific Gravity, Urine: 1.029 (ref 1.005–1.030)
UROBILINOGEN UA: 1 mg/dL (ref 0.0–1.0)
pH: 6.5 (ref 5.0–8.0)

## 2015-01-11 LAB — POC URINE PREG, ED: PREG TEST UR: NEGATIVE

## 2015-01-11 MED ORDER — DIAZEPAM 5 MG/ML IJ SOLN
5.0000 mg | Freq: Once | INTRAMUSCULAR | Status: AC
  Administered 2015-01-11: 5 mg via INTRAMUSCULAR
  Filled 2015-01-11: qty 2

## 2015-01-11 MED ORDER — CYCLOBENZAPRINE HCL 10 MG PO TABS: 10.0000 mg | ORAL_TABLET | Freq: Two times a day (BID) | ORAL | Status: DC | PRN

## 2015-01-11 MED ORDER — KETOROLAC TROMETHAMINE 15 MG/ML IJ SOLN: 30.0000 mg | Freq: Once | INTRAMUSCULAR | Status: DC

## 2015-01-11 MED ORDER — POLYETHYLENE GLYCOL 3350 17 G PO PACK: 17.0000 g | PACK | Freq: Every day | ORAL | Status: DC

## 2015-01-11 MED ORDER — KETOROLAC TROMETHAMINE 60 MG/2ML IM SOLN
60.0000 mg | Freq: Once | INTRAMUSCULAR | Status: AC
  Administered 2015-01-11: 60 mg via INTRAMUSCULAR
  Filled 2015-01-11: qty 2

## 2015-01-11 NOTE — ED Notes (Signed)
Pt has not returned to room 

## 2015-01-11 NOTE — ED Notes (Signed)
Pt reports she woke up 1 week ago with lower back pain that occasionally makes her L leg weak. Hx of problems with sciatic nerve. Pt also reports she has not had a bowel movement since her back started hurting. Denies urinary or vaginal sx.

## 2015-01-11 NOTE — ED Notes (Signed)
Patient transported to X-ray 

## 2015-01-11 NOTE — ED Provider Notes (Signed)
CSN: [id number]     Arrival date & time 01/11/15  1448 History   First MD Initiated Contact with Patient 01/11/15 1700     Chief Complaint  Patient presents with  . Back Pain    The history is provided by the patient and medical records.   29 year old female with history of asthma, obesity presenting with hip and back pain. Onset was about 1 week ago. Context- patient woke up and noted that she had pain in her left hip. Similar to prior episodes, the patient states this episode has lasted longer than any priors. Located in posterior left hip, radiating down leg and 2 lumbar spine. Worse with movement or extended periods of sitting. Not alleviated by OxyContin, Percocet, tramadol, NSAIDs. Described as burning. Denies associated perianal paresthesias, bowel/bladder incontinence, loss of balance, weakness, numbness. Does state that she has to constipation since taking these pain medications. States passing flatus but not having a bowel movement for about 4 days.   Past Medical History  Diagnosis Date  . Asthma   . Pneumonia   . Obesity    Past Surgical History  Procedure Laterality Date  . Wrist surgery    . Left wrist fracture     Family History  Problem Relation Age of Onset  . Hypertension Mother   . Stroke Mother   . Cancer Maternal Aunt    Social History  Substance Use Topics  . Smoking status: Current Every Day Smoker -- 0.50 packs/day    Types: Cigarettes  . Smokeless tobacco: None  . Alcohol Use: 0.6 oz/week    1 Shots of liquor per week   OB History    No data available     Review of Systems  Constitutional: Negative for fever.  HENT: Negative for rhinorrhea.   Eyes: Negative for visual disturbance.  Respiratory: Negative for shortness of breath.   Cardiovascular: Negative for chest pain.  Gastrointestinal: Positive for constipation. Negative for vomiting and abdominal pain.  Genitourinary: Negative for decreased urine volume.  Musculoskeletal: Positive for  back pain and arthralgias.  Skin: Negative for rash.  Allergic/Immunologic: Negative for immunocompromised state.  Neurological: Negative for syncope.  Psychiatric/Behavioral: Negative for confusion.      Allergies  Review of patient's allergies indicates no known allergies.  Home Medications   Prior to Admission medications   Medication Sig Start Date End Date Taking? Authorizing Provider  levonorgestrel (MIRENA) 20 MCG/24HR IUD 1 each by Intrauterine route once.   Yes Historical Provider, MD  OxyCODONE (OXYCONTIN) 10 mg T12A 12 hr tablet Take 10 mg by mouth 2 (two) times daily as needed (pain).   Yes Historical Provider, MD  oxyCODONE-acetaminophen (PERCOCET) 7.5-325 MG tablet Take 1 tablet by mouth every 4 (four) hours as needed for moderate pain or severe pain.   Yes Historical Provider, MD  naproxen (NAPROSYN) 500 MG tablet Take 1 tablet (500 mg total) by mouth 2 (two) times daily. Patient not taking: Reported on 01/11/2015 11/19/14   Renne CriglerJoshua Geiple, PA-C  traMADol (ULTRAM) 50 MG tablet Take 1 tablet (50 mg total) by mouth every 6 (six) hours as needed. Patient not taking: Reported on 01/11/2015 11/19/14   Renne CriglerJoshua Geiple, PA-C   BP 133/71 mmHg  Pulse 68  Temp(Src) 99 F (37.2 C) (Oral)  Resp 16  SpO2 100% Physical Exam  Constitutional: She is oriented to person, place, and time. She appears well-developed and well-nourished. No distress.  HENT:  Head: Normocephalic and atraumatic.  Eyes: Right eye exhibits no discharge.  Left eye exhibits no discharge.  Neck: No tracheal deviation present.  Cardiovascular: Normal rate and regular rhythm.   Pulmonary/Chest: Effort normal and breath sounds normal. No respiratory distress.  Abdominal: Soft. She exhibits no distension. There is no tenderness.  Musculoskeletal: Normal range of motion. She exhibits tenderness. She exhibits no edema.  No midline ttp over spinous processes. TTP over iliac crest. Indeterminate straight leg raise test.  bilat LE with strength 5/5 at hip flex/ext, knee flex/ext, ankle and great toe PF/DF.   Neurological: She is alert and oriented to person, place, and time. She has normal strength and normal reflexes. She displays no atrophy. No sensory deficit. Coordination and gait normal.  Skin: Skin is warm and dry.  Psychiatric: She has a normal mood and affect. Her behavior is normal.  Nursing note and vitals reviewed.   ED Course  Procedures (including critical care time) Labs Review Labs Reviewed  URINALYSIS, ROUTINE W REFLEX MICROSCOPIC (NOT AT [facility]) - Abnormal; Notable for the following:    Color, Urine AMBER (*)    APPearance CLOUDY (*)    All other components within normal limits  POC URINE PREG, ED    Imaging Review Dg Lumbar Spine Complete  01/11/2015  CLINICAL DATA:  Hip pain the radiates to lower back for 1 week. LEFT hip pain. EXAM: LUMBAR SPINE - COMPLETE 4+ VIEW COMPARISON:  CT 01/19/1999 14 FINDINGS: Normal alignment of the lumbar vertebral bodies. No loss vertebral body height or disc height. No pars fracture. IMPRESSION: No acute osseous abnormality. Electronically Signed   By: Genevive Bi M.D.   On: 01/11/2015 19:04   Dg Hip Unilat With Pelvis 2-3 Views Left  01/11/2015  CLINICAL DATA:  Hip pain the radiates to lower back for 1 week. EXAM: DG HIP (WITH OR WITHOUT PELVIS) 2-3V LEFT COMPARISON:  CT 01/18/2013 FINDINGS: Hips are located. Joint spaces are normal. Dedicated view LEFT hip demonstrates no fracture dislocation. No significant arthropathy. IMPRESSION: No acute osseous abnormality. Electronically Signed   By: Genevive Bi M.D.   On: 01/11/2015 19:02   I have personally reviewed and evaluated these images and lab results as part of my medical decision-making.   EKG Interpretation None      MDM   Final diagnoses:  Hip pain  Left low back pain, with sciatica presence unspecified    29 year old female with history of asthma, obesity presenting with hip and  back pain. Atraumatic. Neuro intact. No red flag symptoms. Afebrile and well-appearing. No UTI, hematuria, or pregnancy. Discussed exercises and symptomatic management with patient, need to establish care with primary care physician. Offered Flexeril. Patient left before discharge papers were given to her. Verbal discharge instructions were given prior to patient leaving the department.  Case discussed with Dr. Silverio Lay, who oversaw management of this patient.     Urban Gibson, MD 01/11/15 1610  Richardean Canal, MD 01/13/15 [phone]

## 2015-01-11 NOTE — Discharge Instructions (Signed)

## 2015-01-11 NOTE — ED Notes (Signed)
Pt c/o lower back pain x7 days, denies falling. Intermittent pain in both legs, current burning in the left hip. Pt stated she tried 2 oxycontin (10mg  each) and percocet 7.5 325 yesterday with no relief from the pain. Pt also states she has not had a bowel movement since 01/06/15. 7/10 pain

## 2015-01-11 NOTE — ED Notes (Signed)
ED resident at bedside.

## 2015-01-11 NOTE — ED Notes (Signed)
Pt is not in room at this time to receive her discharge papers.

## 2015-04-06 ENCOUNTER — Emergency Department (HOSPITAL_COMMUNITY)
Admission: EM | Admit: 2015-04-06 | Discharge: 2015-04-06 | Disposition: A | Discharge status: Home / Self Care | Payer: Medicaid Other | Attending: Emergency Medicine | Admitting: Emergency Medicine

## 2015-04-06 ENCOUNTER — Encounter (HOSPITAL_COMMUNITY): Payer: Self-pay | Admitting: Emergency Medicine

## 2015-04-06 ENCOUNTER — Emergency Department (HOSPITAL_COMMUNITY): Payer: Medicaid Other

## 2015-04-06 DIAGNOSIS — Z975 Presence of (intrauterine) contraceptive device: Secondary | ICD-10-CM | POA: Insufficient documentation

## 2015-04-06 DIAGNOSIS — Z8701 Personal history of pneumonia (recurrent): Secondary | ICD-10-CM | POA: Diagnosis not present

## 2015-04-06 DIAGNOSIS — J45901 Unspecified asthma with (acute) exacerbation: Secondary | ICD-10-CM | POA: Diagnosis not present

## 2015-04-06 DIAGNOSIS — R0603 Acute respiratory distress: Secondary | ICD-10-CM

## 2015-04-06 DIAGNOSIS — F17210 Nicotine dependence, cigarettes, uncomplicated: Secondary | ICD-10-CM | POA: Diagnosis not present

## 2015-04-06 DIAGNOSIS — J80 Acute respiratory distress syndrome: Secondary | ICD-10-CM | POA: Diagnosis not present

## 2015-04-06 DIAGNOSIS — R000 Tachycardia, unspecified: Secondary | ICD-10-CM | POA: Diagnosis not present

## 2015-04-06 DIAGNOSIS — R0602 Shortness of breath: Secondary | ICD-10-CM | POA: Diagnosis present

## 2015-04-06 DIAGNOSIS — R079 Chest pain, unspecified: Secondary | ICD-10-CM | POA: Diagnosis not present

## 2015-04-06 DIAGNOSIS — E669 Obesity, unspecified: Secondary | ICD-10-CM | POA: Insufficient documentation

## 2015-04-06 LAB — BASIC METABOLIC PANEL
Anion gap: 10 (ref 5–15)
BUN: 7 mg/dL (ref 6–20)
CO2: 22 mmol/L (ref 22–32)
CREATININE: 1 mg/dL (ref 0.44–1.00)
Calcium: 9.5 mg/dL (ref 8.9–10.3)
Chloride: 107 mmol/L (ref 101–111)
GFR calc Af Amer: >60 mL/min (ref >60)
GLUCOSE: 106 mg/dL — AB (ref 65–99)
POTASSIUM: 3.6 mmol/L (ref 3.5–5.1)
SODIUM: 139 mmol/L (ref 135–145)

## 2015-04-06 LAB — CBC
HEMATOCRIT: 37.7 % (ref 36.0–46.0)
Hemoglobin: 12.8 g/dL (ref 12.0–15.0)
MCH: 27.2 pg (ref 26.0–34.0)
MCHC: 34 g/dL (ref 30.0–36.0)
MCV: 80.2 fL (ref 78.0–100.0)
PLATELETS: 199 10*3/uL (ref 150–400)
RBC: 4.7 MIL/uL (ref 3.87–5.11)
RDW: 14 % (ref 11.5–15.5)
WBC: 8.8 10*3/uL (ref 4.0–10.5)

## 2015-04-06 MED ORDER — SODIUM CHLORIDE 0.9 % IV BOLUS (SEPSIS)
1000.0000 mL | Freq: Once | INTRAVENOUS | Status: AC
  Administered 2015-04-06: 1000 mL via INTRAVENOUS

## 2015-04-06 MED ORDER — DEXTROMETHORPHAN-GUAIFENESIN 10-100 MG/5ML PO LIQD: 10.0000 mL | ORAL | Status: DC | PRN

## 2015-04-06 MED ORDER — ALBUTEROL SULFATE (2.5 MG/3ML) 0.083% IN NEBU
5.0000 mg | INHALATION_SOLUTION | Freq: Once | RESPIRATORY_TRACT | Status: AC
  Administered 2015-04-06: 5 mg via RESPIRATORY_TRACT
  Filled 2015-04-06: qty 6

## 2015-04-06 MED ORDER — METHYLPREDNISOLONE SODIUM SUCC 125 MG IJ SOLR
125.0000 mg | Freq: Once | INTRAMUSCULAR | Status: AC
  Administered 2015-04-06: 125 mg via INTRAVENOUS
  Filled 2015-04-06: qty 2

## 2015-04-06 MED ORDER — PREDNISONE 20 MG PO TABS: 40.0000 mg | ORAL_TABLET | Freq: Every day | ORAL | Status: AC

## 2015-04-06 MED ORDER — ALBUTEROL SULFATE HFA 108 (90 BASE) MCG/ACT IN AERS
2.0000 | INHALATION_SPRAY | Freq: Once | RESPIRATORY_TRACT | Status: AC
  Administered 2015-04-06: 2 via RESPIRATORY_TRACT
  Filled 2015-04-06: qty 6.7

## 2015-04-06 NOTE — ED Provider Notes (Signed)
CSN: [id number]     Arrival date & time 04/06/15  0520 History   First MD Initiated Contact with Patient 04/06/15 0522     Chief Complaint  Patient presents with  . Shortness of Breath  . Cough  . Chest Pain     (Consider location/radiation/quality/duration/timing/severity/associated sxs/prior Treatment) HPI patient presents with 2 days of cough, dyspnea. There is left-sided chest pain associated with coughing. Patient ran out of albuterol inhaler acknowledges a history of asthma, smokes. Since onset symptoms of been progressive, with increasing generalized discomfort, soreness, particularly with coughing.   No abdominal pain, vomiting, diarrhea, fever.   Past Medical History  Diagnosis Date  . Asthma   . Pneumonia   . Obesity    Past Surgical History  Procedure Laterality Date  . Wrist surgery    . Left wrist fracture     Family History  Problem Relation Age of Onset  . Hypertension Mother   . Stroke Mother   . Cancer Maternal Aunt    Social History  Substance Use Topics  . Smoking status: Current Every Day Smoker -- 0.50 packs/day    Types: Cigarettes  . Smokeless tobacco: None  . Alcohol Use: 0.6 oz/week    1 Shots of liquor per week   OB History    No data available     Review of Systems  Constitutional: Negative for fever.  Respiratory: Negative for shortness of breath.   Cardiovascular: Negative for chest pain.  Musculoskeletal:       Negative aside from HPI  Skin:       Negative aside from HPI  Allergic/Immunologic: Negative for immunocompromised state.  Neurological: Negative for weakness.      Allergies  Review of patient's allergies indicates no known allergies.  Home Medications   Prior to Admission medications   Medication Sig Start Date End Date Taking? Authorizing Provider  levonorgestrel (MIRENA) 20 MCG/24HR IUD 1 each by Intrauterine route once.   Yes Historical Provider, MD   BP 122/46 mmHg  Pulse 128  Temp(Src) 99.2 F (37.3  C) (Oral)  Resp 20  Ht 5\' 6"  (1.676 m)  Wt 229 lb (103.874 kg)  BMI 36.98 kg/m2  SpO2 95% Physical Exam  Constitutional: She is oriented to person, place, and time. She appears well-developed and well-nourished. No distress.  HENT:  Head: Normocephalic and atraumatic.  Eyes: Conjunctivae and EOM are normal.  Cardiovascular: Regular rhythm.  Tachycardia present.   Pulmonary/Chest: Accessory muscle usage present. No stridor. Tachypnea noted. She is in respiratory distress. She has decreased breath sounds. She has wheezes.  Abdominal: She exhibits no distension.  Musculoskeletal: She exhibits no edema.  Neurological: She is alert and oriented to person, place, and time. No cranial nerve deficit.  Skin: Skin is warm and dry.  Psychiatric: She has a normal mood and affect.  Nursing note and vitals reviewed.   ED Course  Procedures (including critical care time) Labs Review Labs Reviewed  BASIC METABOLIC PANEL - Abnormal; Notable for the following:    Glucose, Bld 106 (*)    All other components within normal limits  CBC    Imaging Review Dg Chest 2 View  04/06/2015  CLINICAL DATA:  Acute onset of cough, shortness of breath and fever. Initial encounter. EXAM: CHEST  2 VIEW COMPARISON:  Chest radiograph performed 10/10/2013 FINDINGS: The lungs are well-aerated and clear. There is no evidence of focal opacification, pleural effusion or pneumothorax. The heart is normal in size; the mediastinal contour is  within normal limits. No acute osseous abnormalities are seen. IMPRESSION: No acute cardiopulmonary process seen. Electronically Signed   By: Roanna RaiderJeffery  Chang M.D.   On: 04/06/2015 06:44   I have personally reviewed and evaluated these images and lab results as part of my medical decision-making.   EKG Interpretation   Date/Time:  Friday April 06 2015 05:30:34 EST Ventricular Rate:  101 PR Interval:  133 QRS Duration: 79 QT Interval:  318 QTC Calculation: 412 R Axis:   79 Text  Interpretation:  Sinus tachycardia Borderline repolarization  abnormality Baseline wander in lead(s) II aVR Sinus tachycardia Artifact  Abnormal ekg Confirmed by Gerhard MunchLOCKWOOD, Kunal Levario  MD ([id number]) on 04/06/2015 5:37:49  AM     7:09 AM Following several breathing treatments, the patient is substantial better, breath sounds are clear. I discussed all findings with her and her husband, home care instructions, return precautions.  MDM  Patient presents with several days of cough, congestion, generalized discomfort with coughing. Initially, the patient is in respiratory distress, tachypnea, tachycardia, increased work of breathing, wheezing bilaterally. Patient presents essentially with multiple breathing treatments, initiation of steroids. X-ray does not demonstrate pneumonia. Patient discharged in stable condition.  Gerhard Munchobert Delpha Perko, MD 04/06/15 [phone]

## 2015-04-06 NOTE — ED Notes (Signed)
Pt states she is been having a bad cough for 2 days and now is getting worse to the point where she is very SOB and having 8/10 CP on the right side. Pt is been having fevers and chills.

## 2015-04-06 NOTE — Discharge Instructions (Signed)
As discussed, with your respiratory difficulty is important that you monitor your condition carefully.  For the next day, please use your divided albuterol every 4 hours.  Subsequently you may use it as needed.  In addition, please take the provided steroids for the next 3 days.  Return here for any concerning changes in your condition.

## 2015-04-12 ENCOUNTER — Emergency Department (HOSPITAL_COMMUNITY): Payer: Medicaid Other

## 2015-04-12 ENCOUNTER — Encounter (HOSPITAL_COMMUNITY): Payer: Self-pay

## 2015-04-12 ENCOUNTER — Emergency Department (HOSPITAL_COMMUNITY)
Admission: EM | Admit: 2015-04-12 | Discharge: 2015-04-12 | Discharge status: Against Medical Advice | Payer: Medicaid Other | Attending: Emergency Medicine | Admitting: Emergency Medicine

## 2015-04-12 DIAGNOSIS — F17210 Nicotine dependence, cigarettes, uncomplicated: Secondary | ICD-10-CM | POA: Insufficient documentation

## 2015-04-12 DIAGNOSIS — R0602 Shortness of breath: Secondary | ICD-10-CM

## 2015-04-12 DIAGNOSIS — E669 Obesity, unspecified: Secondary | ICD-10-CM | POA: Insufficient documentation

## 2015-04-12 DIAGNOSIS — R51 Headache: Secondary | ICD-10-CM | POA: Insufficient documentation

## 2015-04-12 DIAGNOSIS — R519 Headache, unspecified: Secondary | ICD-10-CM

## 2015-04-12 DIAGNOSIS — J45901 Unspecified asthma with (acute) exacerbation: Secondary | ICD-10-CM | POA: Diagnosis not present

## 2015-04-12 LAB — CBC
HEMATOCRIT: 36.5 % (ref 36.0–46.0)
HEMOGLOBIN: 12.3 g/dL (ref 12.0–15.0)
MCH: 27 pg (ref 26.0–34.0)
MCHC: 33.7 g/dL (ref 30.0–36.0)
MCV: 80 fL (ref 78.0–100.0)
Platelets: 253 10*3/uL (ref 150–400)
RBC: 4.56 MIL/uL (ref 3.87–5.11)
RDW: 14.1 % (ref 11.5–15.5)
WBC: 11.9 10*3/uL — ABNORMAL HIGH (ref 4.0–10.5)

## 2015-04-12 LAB — BASIC METABOLIC PANEL
ANION GAP: 9 (ref 5–15)
BUN: 6 mg/dL (ref 6–20)
CO2: 26 mmol/L (ref 22–32)
Calcium: 9.3 mg/dL (ref 8.9–10.3)
Chloride: 106 mmol/L (ref 101–111)
Creatinine, Ser: 0.9 mg/dL (ref 0.44–1.00)
GFR calc non Af Amer: >60 mL/min (ref >60)
GLUCOSE: 88 mg/dL (ref 65–99)
Potassium: 3.6 mmol/L (ref 3.5–5.1)
Sodium: 141 mmol/L (ref 135–145)

## 2015-04-12 LAB — I-STAT TROPONIN, ED: Troponin i, poc: 0 ng/mL (ref 0.00–0.08)

## 2015-04-12 MED ORDER — ALBUTEROL SULFATE (2.5 MG/3ML) 0.083% IN NEBU
5.0000 mg | INHALATION_SOLUTION | Freq: Once | RESPIRATORY_TRACT | Status: AC
  Administered 2015-04-12: 5 mg via RESPIRATORY_TRACT

## 2015-04-12 MED ORDER — ALBUTEROL SULFATE (2.5 MG/3ML) 0.083% IN NEBU: 5.0000 mg | INHALATION_SOLUTION | Freq: Once | RESPIRATORY_TRACT | Status: DC

## 2015-04-12 MED ORDER — ALBUTEROL SULFATE (2.5 MG/3ML) 0.083% IN NEBU
INHALATION_SOLUTION | RESPIRATORY_TRACT | Status: AC
  Filled 2015-04-12: qty 6

## 2015-04-12 NOTE — ED Notes (Signed)
Pt returned from x-ray and roomed. Pt in position of comfort.

## 2015-04-12 NOTE — ED Provider Notes (Signed)
MSE was initiated and I personally evaluated the patient and placed orders (if any) at  11:03 PM on April 12, 2015.  Blood pressure 148/84, pulse 100, temperature 98 F (36.7 C), temperature source Oral, resp. rate 20, height 5\' 6"  (1.676 m), SpO2 100 %.  Victoria Cook is a 30 y.o. female complaining of shortness of breath and severe headache. No respiratory distress, patient is speaking in complete sentences, excellent air movement with mild expiratory wheezing. Patient is very agitated, multiple complaints, will need higher level of care than can be safely accomplished in fast track. Follows commands, Clear, goal oriented speech, Strength is 5 out of 5x4 extremities, patient ambulates with a coordinated in nonantalgic gait. Sensation is grossly intact.  No overt respiratory distress. Patient screened for stroke, neuro exam negative, return to the waiting room.  The patient appears stable so that the remainder of the MSE may be completed by another provider.  Victoria Emeryicole Cheng Dec, PA-C 04/12/15 2304  Victoria EmeryNicole Marne Meline, PA-C 04/12/15 2349  Victoria Palumbo, MD 04/12/15 2354

## 2015-04-12 NOTE — ED Notes (Signed)
Pt moved back to triage

## 2015-04-12 NOTE — ED Notes (Signed)
Pt ambulatory out of ER- sts she is leaving. nad noted.

## 2015-04-12 NOTE — ED Notes (Signed)
Pt here for asthma and was seen here 4-5 days ago for the same . Given medication but doesn't feel like it's working. States the SOB started getting worse today while she was sleeping.

## 2018-03-09 ENCOUNTER — Encounter (HOSPITAL_COMMUNITY): Payer: Self-pay | Admitting: Emergency Medicine

## 2018-03-09 ENCOUNTER — Emergency Department (HOSPITAL_COMMUNITY)
Admission: EM | Admit: 2018-03-09 | Discharge: 2018-03-09 | Disposition: A | Discharge status: Home / Self Care | Payer: Self-pay | Attending: Emergency Medicine | Admitting: Emergency Medicine

## 2018-03-09 DIAGNOSIS — Y9389 Activity, other specified: Secondary | ICD-10-CM | POA: Insufficient documentation

## 2018-03-09 DIAGNOSIS — Z79899 Other long term (current) drug therapy: Secondary | ICD-10-CM | POA: Insufficient documentation

## 2018-03-09 DIAGNOSIS — M436 Torticollis: Secondary | ICD-10-CM | POA: Insufficient documentation

## 2018-03-09 DIAGNOSIS — J45909 Unspecified asthma, uncomplicated: Secondary | ICD-10-CM | POA: Insufficient documentation

## 2018-03-09 DIAGNOSIS — Y999 Unspecified external cause status: Secondary | ICD-10-CM | POA: Insufficient documentation

## 2018-03-09 DIAGNOSIS — Y92009 Unspecified place in unspecified non-institutional (private) residence as the place of occurrence of the external cause: Secondary | ICD-10-CM | POA: Insufficient documentation

## 2018-03-09 DIAGNOSIS — S161XXA Strain of muscle, fascia and tendon at neck level, initial encounter: Secondary | ICD-10-CM | POA: Insufficient documentation

## 2018-03-09 DIAGNOSIS — X509XXA Other and unspecified overexertion or strenuous movements or postures, initial encounter: Secondary | ICD-10-CM | POA: Insufficient documentation

## 2018-03-09 DIAGNOSIS — F17210 Nicotine dependence, cigarettes, uncomplicated: Secondary | ICD-10-CM | POA: Insufficient documentation

## 2018-03-09 MED ORDER — CYCLOBENZAPRINE HCL 10 MG PO TABS: 10.0000 mg | ORAL_TABLET | Freq: Two times a day (BID) | ORAL | 0 refills | Status: DC | PRN

## 2018-03-09 MED ORDER — HYDROCODONE-ACETAMINOPHEN 5-325 MG PO TABS
1.0000 | ORAL_TABLET | Freq: Once | ORAL | Status: AC
  Administered 2018-03-09: 1 via ORAL
  Filled 2018-03-09: qty 1

## 2018-03-09 MED ORDER — KETOROLAC TROMETHAMINE 60 MG/2ML IM SOLN
60.0000 mg | Freq: Once | INTRAMUSCULAR | Status: AC
  Administered 2018-03-09: 60 mg via INTRAMUSCULAR
  Filled 2018-03-09: qty 2

## 2018-03-09 NOTE — ED Triage Notes (Signed)
Pt stated L sided neck pain started today, pt leaned forward and felt sharp pain. Denies other symptoms

## 2018-03-09 NOTE — ED Provider Notes (Signed)
[facility] EMERGENCY DEPARTMENT Provider Note   CSN: [id number] Arrival date & time: 03/09/18  1743     History   Chief Complaint Chief Complaint  Patient presents with  . Neck Pain    HPI Victoria Cook is a 32 y.o. female history of asthma, obesity, pneumonia who presents for evaluation of left-sided neck pain that began today.  Patient reports when she woke up this morning, she was having some posterior left neck soreness.  She states that she was at home riding and states that she turned that caused worsening pain in the posterior aspect of her left neck.  She reports since then, she has had limited movement of the neck secondary to pain.  She has not taken any medication for the pain.  Patient denies any trauma, fall, injury.  Patient reports that she has not had any recent fevers, congestion.  Patient denies any vision changes, numbness/weakness of arms or legs, difficulty ambulating. Denies fevers, weight loss, numbness/weakness of upper and lower extremities, bowel/bladder incontinence, saddle anesthesia, history of back surgery, history of IVDA.   The history is provided by the patient.    Past Medical History:  Diagnosis Date  . Asthma   . Obesity   . Pneumonia     Patient Active Problem List   Diagnosis Date Noted  . Gonorrhea in female 03/03/2013    Past Surgical History:  Procedure Laterality Date  . left wrist fracture    . WRIST SURGERY       OB History   None      Home Medications    Prior to Admission medications   Medication Sig Start Date End Date Taking? Authorizing Provider  cyclobenzaprine (FLEXERIL) 10 MG tablet Take 1 tablet (10 mg total) by mouth 2 (two) times daily as needed for muscle spasms. 03/09/18   Maxwell Caul, PA-C  dextromethorphan-guaiFENesin (TUSSIN DM) 10-100 MG/5ML liquid Take 10 mLs by mouth every 4 (four) hours as needed for cough. 04/06/15   Gerhard Munch, MD  levonorgestrel (MIRENA) 20 MCG/24HR  IUD 1 each by Intrauterine route once.    [provider]    Family History Family History  Problem Relation Age of Onset  . Hypertension Mother   . Stroke Mother   . Cancer Maternal Aunt     Social History Social History   Tobacco Use  . Smoking status: Current Every Day Smoker    Packs/day: 0.50    Types: Cigarettes  . Smokeless tobacco: Never Used  Substance Use Topics  . Alcohol use: Yes    Alcohol/week: 1.0 standard drinks    Types: 1 Shots of liquor per week  . Drug use: Yes    Frequency: 7.0 times per week    Types: Marijuana     Allergies   Patient has no known allergies.   Review of Systems Review of Systems  Constitutional: Negative for fever.  Eyes: Negative for visual disturbance.  Musculoskeletal: Positive for neck pain. Negative for back pain.  Neurological: Negative for weakness and numbness.  All other systems reviewed and are negative.    Physical Exam Updated Vital Signs BP (!) 141/95   Pulse 90   Temp 99.1 F (37.3 C) (Oral)   Resp 18   Ht 5\' 6"  (1.676 m)   Wt 97.5 kg   SpO2 100%   BMI 34.70 kg/m   Physical Exam  Constitutional: She appears well-developed and well-nourished.  HENT:  Head: Normocephalic and atraumatic.  Eyes:  Pupils are equal, round, and reactive to light. Conjunctivae and EOM are normal. Right eye exhibits no discharge. Left eye exhibits no discharge. No scleral icterus.  PERRL ,EOMS intact without any difficulty.  Neck: Neck supple. Muscular tenderness present. No spinous process tenderness present. Carotid bruit is not present. No neck rigidity. Decreased range of motion present.    Tenderness palpation noted the paraspinal muscles of the left cervical region.  No midline tenderness.  No tenderness noted to right paraspinal muscles.  Decreased range of motion secondary to pain.  She has good flexion/tension but has difficulty turning laterally to the left secondary to pain.  Positive Spurling's maneuver.   No carotid bruit noted.  Pulmonary/Chest: Effort normal.  Neurological: She is alert.  Cranial nerves III-XII intact Follows commands, Moves all extremities  5/5 strength to BUE and BLE  Sensation intact throughout all major nerve distributions Normal coordination No pronator drift. No gait abnormalities  No slurred speech. No facial droop.   Skin: Skin is warm and dry.  Psychiatric: She has a normal mood and affect. Her speech is normal and behavior is normal.  Nursing note and vitals reviewed.    ED Treatments / Results  Labs (all labs ordered are listed, but only abnormal results are displayed) Labs Reviewed - No data to display  EKG None  Radiology No results found.  Procedures Procedures (including critical care time)  Medications Ordered in ED Medications  ketorolac (TORADOL) injection 60 mg (60 mg Intramuscular Given 03/09/18 1814)  HYDROcodone-acetaminophen (NORCO/VICODIN) 5-325 MG per tablet 1 tablet (1 tablet Oral Given 03/09/18 1914)     Initial Impression / Assessment and Plan / ED Course  I have reviewed the triage vital signs and the nursing notes.  Pertinent labs & imaging results that were available during my care of the patient were reviewed by me and considered in my medical decision making (see chart for details).     32 year old female who presents for evaluation of left-sided neck pain that began today.  Patient reports when she woke up this morning, the neck was sore and then throughout the day, it continued to get worsened.  Patient reports that she turned and had a pain to the posterior neck.  No vision changes, numbness/weakness. Patient is afebrile, non-toxic appearing, sitting comfortably on examination table. Vital signs reviewed and stable. No neuro deficits noted on exam. Consider cervical muscle strain/torticollis. History/physical exam is not concerning for spinal abscess, cauda equina, carotid dissection.  Additionally, history/physical  exam not concerning for meningitis.  Plan to treat with symptomatic relief for pain.  Patient currently on at home supportive care measures. Patient had ample opportunity for questions and discussion. All patient's questions were answered with full understanding. Strict return precautions discussed. Patient expresses understanding and agreement to plan.   Final Clinical Impressions(s) / ED Diagnoses   Final diagnoses:  Acute strain of neck muscle, initial encounter  Torticollis    ED Discharge Orders         Ordered    cyclobenzaprine (FLEXERIL) 10 MG tablet  2 times daily PRN     03/09/18 1906           Rosana HoesLayden, Lindsey A, PA-C 03/09/18 2254    Mancel BaleWentz, Elliott, MD 03/10/18 2115

## 2018-03-09 NOTE — Discharge Instructions (Signed)
You can take Tylenol or Ibuprofen as directed for pain. You can alternate Tylenol and Ibuprofen every 4 hours. If you take Tylenol at 1pm, then you can take Ibuprofen at 5pm. Then you can take Tylenol again at 9pm.   Take Flexeril as prescribed. This medication will make you drowsy so do not drive or drink alcohol when taking it.  Apply heat to the affected area with pain.  Return to emergency department for fever, vision changes, difficulty moving arms or legs, difficulty walking or any other worsening or concerning symptoms.

## 2018-03-09 NOTE — ED Notes (Signed)
Patient verbalizes understanding of discharge instructions. Opportunity for questioning and answers were provided. Armband removed by staff, pt discharged from ED ambulatory to home.  

## 2018-10-24 ENCOUNTER — Other Ambulatory Visit: Payer: Self-pay

## 2018-10-24 ENCOUNTER — Encounter (HOSPITAL_COMMUNITY): Payer: Self-pay

## 2018-10-24 ENCOUNTER — Emergency Department (HOSPITAL_COMMUNITY)
Admission: EM | Admit: 2018-10-24 | Discharge: 2018-10-24 | Disposition: A | Discharge status: Home / Self Care | Payer: Self-pay | Attending: Emergency Medicine | Admitting: Emergency Medicine

## 2018-10-24 DIAGNOSIS — J45909 Unspecified asthma, uncomplicated: Secondary | ICD-10-CM | POA: Insufficient documentation

## 2018-10-24 DIAGNOSIS — F17210 Nicotine dependence, cigarettes, uncomplicated: Secondary | ICD-10-CM | POA: Insufficient documentation

## 2018-10-24 DIAGNOSIS — R1013 Epigastric pain: Secondary | ICD-10-CM

## 2018-10-24 LAB — CBC WITH DIFFERENTIAL/PLATELET
Abs Immature Granulocytes: 0.01 10*3/uL (ref 0.00–0.07)
Basophils Absolute: 0 10*3/uL (ref 0.0–0.1)
Basophils Relative: 0 %
Eosinophils Absolute: 0.2 10*3/uL (ref 0.0–0.5)
Eosinophils Relative: 3 %
HCT: 40.8 % (ref 36.0–46.0)
Hemoglobin: 12.7 g/dL (ref 12.0–15.0)
Immature Granulocytes: 0 %
Lymphocytes Relative: 32 %
Lymphs Abs: 2.5 10*3/uL (ref 0.7–4.0)
MCH: 26.7 pg (ref 26.0–34.0)
MCHC: 31.1 g/dL (ref 30.0–36.0)
MCV: 85.9 fL (ref 80.0–100.0)
Monocytes Absolute: 0.5 10*3/uL (ref 0.1–1.0)
Monocytes Relative: 7 %
Neutro Abs: 4.5 10*3/uL (ref 1.7–7.7)
Neutrophils Relative %: 58 %
Platelets: 244 10*3/uL (ref 150–400)
RBC: 4.75 MIL/uL (ref 3.87–5.11)
RDW: 14.6 % (ref 11.5–15.5)
WBC: 7.8 10*3/uL (ref 4.0–10.5)
nRBC: 0 % (ref 0.0–0.2)

## 2018-10-24 LAB — COMPREHENSIVE METABOLIC PANEL
ALT: 16 U/L (ref 0–44)
AST: 18 U/L (ref 15–41)
Albumin: 4.5 g/dL (ref 3.5–5.0)
Alkaline Phosphatase: 60 U/L (ref 38–126)
Anion gap: 7 (ref 5–15)
BUN: 10 mg/dL (ref 6–20)
CO2: 25 mmol/L (ref 22–32)
Calcium: 9.2 mg/dL (ref 8.9–10.3)
Chloride: 104 mmol/L (ref 98–111)
Creatinine, Ser: 0.94 mg/dL (ref 0.44–1.00)
GFR calc Af Amer: >60 mL/min (ref >60)
GFR calc non Af Amer: >60 mL/min (ref >60)
Glucose, Bld: 95 mg/dL (ref 70–99)
Potassium: 4.2 mmol/L (ref 3.5–5.1)
Sodium: 136 mmol/L (ref 135–145)
Total Bilirubin: 0.5 mg/dL (ref 0.3–1.2)
Total Protein: 8 g/dL (ref 6.5–8.1)

## 2018-10-24 LAB — I-STAT BETA HCG BLOOD, ED (MC, WL, AP ONLY): I-stat hCG, quantitative: <5 m[IU]/mL (ref <5)

## 2018-10-24 LAB — LIPASE, BLOOD: Lipase: 21 U/L (ref 11–51)

## 2018-10-24 MED ORDER — FAMOTIDINE 20 MG PO TABS: 20.0000 mg | ORAL_TABLET | Freq: Two times a day (BID) | ORAL | 0 refills | Status: DC

## 2018-10-24 MED ORDER — DICYCLOMINE HCL 10 MG PO CAPS
20.0000 mg | ORAL_CAPSULE | Freq: Once | ORAL | Status: AC
  Administered 2018-10-24: 20 mg via ORAL
  Filled 2018-10-24: qty 2

## 2018-10-24 MED ORDER — DICYCLOMINE HCL 20 MG PO TABS: 20.0000 mg | ORAL_TABLET | Freq: Two times a day (BID) | ORAL | 0 refills | Status: DC

## 2018-10-24 MED ORDER — PANTOPRAZOLE SODIUM 20 MG PO TBEC: 20.0000 mg | DELAYED_RELEASE_TABLET | Freq: Every day | ORAL | 1 refills | Status: DC

## 2018-10-24 NOTE — ED Notes (Signed)
Patient given discharge teaching and verbalized understanding. Patient ambulated out of ED with a steady gait. 

## 2018-10-24 NOTE — ED Provider Notes (Signed)
[facility] DEPT Provider Note   CSN: [id number] Arrival date & time: 10/24/18  1037    History   Chief Complaint Chief Complaint  Patient presents with  . Abdominal Pain    HPI Victoria Cook is a 33 y.o. female.     HPI   Victoria Cook is a 33 y.o. female, with a history of asthma, obesity, presenting to the ED with epigastric discomfort beginning yesterday.  She is quick to note it is not a pain, but rather states it feels like a pulling or a cramp in a muscle, "very mild," radiating into the lower chest.  She has not had this sensation before. About a week ago she had constipation for 4 days, followed by loose stool for 2 days, then had normal stools for the last 2 days with last bowel movement this morning. The sensation in epigastric region is intermittent, does not seem to be affected by eating or position.  She is socially drinks alcohol, but has not had any alcohol in the last week.  Denies NSAID use.  She is a daily marijuana user. She states she has an IUD in place, but began having menstrual cycles again November 2019.  They have been irregular.  She notes she has not had any bleeding since around May 2020. Denies fever/chills, N/V, cough, chest pain, shortness of breath, flank/back pain, urinary symptoms, hematochezia/melena, or any other complaints.   Past Medical History:  Diagnosis Date  . Asthma   . Obesity   . Pneumonia     Patient Active Problem List   Diagnosis Date Noted  . Gonorrhea in female 03/03/2013    Past Surgical History:  Procedure Laterality Date  . left wrist fracture    . WRIST SURGERY       OB History   No obstetric history on file.      Home Medications    Prior to Admission medications   Medication Sig Start Date End Date Taking? Authorizing Provider  levonorgestrel (MIRENA) 20 MCG/24HR IUD 1 each by Intrauterine route once.   Yes [provider]  cyclobenzaprine (FLEXERIL) 10 MG  tablet Take 1 tablet (10 mg total) by mouth 2 (two) times daily as needed for muscle spasms. Patient not taking: Reported on 10/24/2018 03/09/18   Volanda Napoleon, PA-C  dextromethorphan-guaiFENesin (TUSSIN DM) 10-100 MG/5ML liquid Take 10 mLs by mouth every 4 (four) hours as needed for cough. Patient not taking: Reported on 10/24/2018 04/06/15   Carmin Muskrat, MD  dicyclomine (BENTYL) 20 MG tablet Take 1 tablet (20 mg total) by mouth 2 (two) times daily. 10/24/18   Maddalynn Barnard C, PA-C  famotidine (PEPCID) 20 MG tablet Take 1 tablet (20 mg total) by mouth 2 (two) times daily for 5 days. 10/24/18 10/29/18  Trigg Delarocha C, PA-C  pantoprazole (PROTONIX) 20 MG tablet Take 1 tablet (20 mg total) by mouth daily. 10/24/18 12/23/18  Lorayne Bender, PA-C    Family History Family History  Problem Relation Age of Onset  . Hypertension Mother   . Stroke Mother   . Cancer Maternal Aunt     Social History Social History   Tobacco Use  . Smoking status: Current Every Day Smoker    Packs/day: 0.15    Types: Cigarettes  . Smokeless tobacco: Never Used  Substance Use Topics  . Alcohol use: Yes    Alcohol/week: 1.0 standard drinks    Types: 1 Shots of liquor per week  . Drug  use: Yes    Frequency: 7.0 times per week    Types: Marijuana     Allergies   Patient has no known allergies.   Review of Systems Review of Systems  Constitutional: Negative for chills and fever.  Respiratory: Negative for cough and shortness of breath.   Cardiovascular: Negative for chest pain.  Gastrointestinal: Positive for abdominal pain. Negative for blood in stool, diarrhea, nausea and vomiting.  Genitourinary: Negative for dysuria, flank pain and hematuria.  Musculoskeletal: Negative for back pain.  Neurological: Negative for dizziness and light-headedness.  All other systems reviewed and are negative.    Physical Exam Updated Vital Signs BP 133/78 (BP Location: Left Arm)   Pulse 82   Temp 98 F (36.7 C)  (Oral)   Resp 18   Ht 5\' 6"  (1.676 m)   Wt 103 kg   SpO2 100%   BMI 36.64 kg/m   Physical Exam Vitals signs and nursing note reviewed.  Constitutional:      General: She is not in acute distress.    Appearance: She is well-developed. She is not diaphoretic.  HENT:     Head: Normocephalic and atraumatic.     Mouth/Throat:     Mouth: Mucous membranes are moist.     Pharynx: Oropharynx is clear.  Eyes:     Conjunctiva/sclera: Conjunctivae normal.  Neck:     Musculoskeletal: Neck supple.  Cardiovascular:     Rate and Rhythm: Normal rate and regular rhythm.     Pulses: Normal pulses.          Radial pulses are 2+ on the right side and 2+ on the left side.       Posterior tibial pulses are 2+ on the right side and 2+ on the left side.     Heart sounds: Normal heart sounds.     Comments: Tactile temperature in the extremities appropriate and equal bilaterally. Pulmonary:     Effort: Pulmonary effort is normal. No respiratory distress.     Breath sounds: Normal breath sounds.  Abdominal:     Palpations: Abdomen is soft.     Tenderness: There is no abdominal tenderness. There is no guarding.     Comments: No tenderness in the abdomen whatsoever.  Musculoskeletal:     Right lower leg: No edema.     Left lower leg: No edema.  Lymphadenopathy:     Cervical: No cervical adenopathy.  Skin:    General: Skin is warm and dry.  Neurological:     Mental Status: She is alert.  Psychiatric:        Mood and Affect: Mood and affect normal.        Speech: Speech normal.        Behavior: Behavior normal.      ED Treatments / Results  Labs (all labs ordered are listed, but only abnormal results are displayed) Labs Reviewed  COMPREHENSIVE METABOLIC PANEL  CBC WITH DIFFERENTIAL/PLATELET  LIPASE, BLOOD  I-STAT BETA HCG BLOOD, ED ([facility], [facility], [facility] ONLY)    EKG None  Radiology No results found.  Procedures Procedures (including critical care time)  Medications Ordered in ED  Medications  dicyclomine (BENTYL) capsule 20 mg (20 mg Oral Given 10/24/18 1122)     Initial Impression / Assessment and Plan / ED Course  I have reviewed the triage vital signs and the nursing notes.  Pertinent labs & imaging results that were available during my care of the patient were reviewed by me and considered  in my medical decision making (see chart for details).  Clinical Course as of Oct 23 1236  Sun Oct 24, 2018  1215 Reviewed lab results with patient.  She states her symptoms have resolved with the Bentyl.   [SJ]    Clinical Course User Index [SJ] Azalyn Sliwa C, PA-C       Patient presents with epigastric discomfort for the last couple days. Patient is nontoxic appearing, afebrile, not tachycardic, not tachypneic, not hypotensive, maintains excellent SPO2 on room air, and is in no apparent distress.  Presentation suggestive of surgical emergency, lab work reassuring, and repeat abdominal exams benign. Discussed imaging with the patient. We decided we would wait and see how her symptoms do over the next several days.  The patient was given instructions for home care as well as strict return precautions. Patient voices understanding of these instructions, accepts the plan, and is comfortable with discharge.     Final Clinical Impressions(s) / ED Diagnoses   Final diagnoses:  Epigastric pain    ED Discharge Orders         Ordered    pantoprazole (PROTONIX) 20 MG tablet  Daily     10/24/18 1235    famotidine (PEPCID) 20 MG tablet  2 times daily     10/24/18 1235    dicyclomine (BENTYL) 20 MG tablet  2 times daily     10/24/18 1235           Concepcion LivingJoy, Naveh Rickles C, PA-C 10/24/18 1238    Linwood DibblesKnapp, Jon, MD 10/25/18 1245

## 2018-10-24 NOTE — Discharge Instructions (Addendum)
If you have continued discomfort, may try the regimen below. Diet: Start with a clear liquid diet, progressed to a full liquid diet, and then bland solids as you are able. Please adhere to the enclosed dietary suggestions.  In general, avoid NSAIDs (i.e. ibuprofen, naproxen, etc.), caffeine, alcohol, spicy foods, fatty foods, or any other foods that seem to cause your symptoms to arise.  Protonix: Take this medication daily, 20-30 minutes prior to your first meal, for the next 8 weeks.  Continue to take this medication even if you begin to feel better.  Pepcid: Take this medication twice a day for the next 5 days.  Dicyclomine: Dicyclomine (generic for Bentyl) is what is known as an antispasmodic and is intended to help reduce abdominal discomfort.  Follow-up: Please follow-up with a primary care provider on this matter.  Return: Return to the ED for significantly worsening symptoms, persistent vomiting, persistent fever, vomiting blood, blood in the stools, dark stools, or any other major concerns.  For prescription assistance, may try using prescription discount sites or apps, such as goodrx.com

## 2018-10-24 NOTE — ED Triage Notes (Signed)
Patient of generalized abdominal discomfort x 2 weeks. patient states she had constipation 4 days ago and then she had 2 days diarrhea. Patient states she is still having gas and a "pullling sensation in her abdomen." Patient denies takes laxatives or stool softeners.

## 2022-05-23 ENCOUNTER — Encounter (INDEPENDENT_AMBULATORY_CARE_PROVIDER_SITE_OTHER): Payer: Self-pay

## 2022-05-23 ENCOUNTER — Encounter (HOSPITAL_COMMUNITY): Payer: Self-pay

## 2022-05-23 ENCOUNTER — Ambulatory Visit (HOSPITAL_COMMUNITY)
Admission: EM | Admit: 2022-05-23 | Discharge: 2022-05-23 | Disposition: A | Discharge status: Home / Self Care | Payer: Medicaid Other | Attending: Physician Assistant | Admitting: Physician Assistant

## 2022-05-23 DIAGNOSIS — M25649 Stiffness of unspecified hand, not elsewhere classified: Secondary | ICD-10-CM | POA: Insufficient documentation

## 2022-05-23 DIAGNOSIS — G8929 Other chronic pain: Secondary | ICD-10-CM | POA: Diagnosis not present

## 2022-05-23 DIAGNOSIS — M2550 Pain in unspecified joint: Secondary | ICD-10-CM | POA: Insufficient documentation

## 2022-05-23 DIAGNOSIS — M545 Low back pain, unspecified: Secondary | ICD-10-CM | POA: Insufficient documentation

## 2022-05-23 LAB — CBC WITH DIFFERENTIAL/PLATELET
Abs Immature Granulocytes: 0.01 10*3/uL (ref 0.00–0.07)
Basophils Absolute: 0 10*3/uL (ref 0.0–0.1)
Basophils Relative: 1 %
Eosinophils Absolute: 0.3 10*3/uL (ref 0.0–0.5)
Eosinophils Relative: 3 %
HCT: 37.4 % (ref 36.0–46.0)
Hemoglobin: 12 g/dL (ref 12.0–15.0)
Immature Granulocytes: 0 %
Lymphocytes Relative: 40 %
Lymphs Abs: 3 10*3/uL (ref 0.7–4.0)
MCH: 26.6 pg (ref 26.0–34.0)
MCHC: 32.1 g/dL (ref 30.0–36.0)
MCV: 82.9 fL (ref 80.0–100.0)
Monocytes Absolute: 0.5 10*3/uL (ref 0.1–1.0)
Monocytes Relative: 6 %
Neutro Abs: 3.8 10*3/uL (ref 1.7–7.7)
Neutrophils Relative %: 50 %
Platelets: 246 10*3/uL (ref 150–400)
RBC: 4.51 MIL/uL (ref 3.87–5.11)
RDW: 14.5 % (ref 11.5–15.5)
WBC: 7.6 10*3/uL (ref 4.0–10.5)
nRBC: 0 % (ref 0.0–0.2)

## 2022-05-23 LAB — COMPREHENSIVE METABOLIC PANEL
ALT: 16 U/L (ref 0–44)
AST: 19 U/L (ref 15–41)
Albumin: 4 g/dL (ref 3.5–5.0)
Alkaline Phosphatase: 47 U/L (ref 38–126)
Anion gap: 10 (ref 5–15)
BUN: 5 mg/dL — ABNORMAL LOW (ref 6–20)
CO2: 23 mmol/L (ref 22–32)
Calcium: 9.3 mg/dL (ref 8.9–10.3)
Chloride: 105 mmol/L (ref 98–111)
Creatinine, Ser: 0.85 mg/dL (ref 0.44–1.00)
GFR, Estimated: >60 mL/min (ref >60)
Glucose, Bld: 87 mg/dL (ref 70–99)
Potassium: 3.9 mmol/L (ref 3.5–5.1)
Sodium: 138 mmol/L (ref 135–145)
Total Bilirubin: 0.6 mg/dL (ref 0.3–1.2)
Total Protein: 6.7 g/dL (ref 6.5–8.1)

## 2022-05-23 LAB — SEDIMENTATION RATE: Sed Rate: 7 mm/hr (ref 0–22)

## 2022-05-23 LAB — C-REACTIVE PROTEIN: CRP: <0.5 mg/dL (ref <1.0)

## 2022-05-23 MED ORDER — PREDNISONE 10 MG (21) PO TBPK: ORAL_TABLET | ORAL | 0 refills | Status: DC

## 2022-05-23 MED ORDER — METHOCARBAMOL 500 MG PO TABS: 500.0000 mg | ORAL_TABLET | Freq: Two times a day (BID) | ORAL | 0 refills | Status: DC

## 2022-05-23 NOTE — Discharge Instructions (Signed)
We will contact you if your lab work is abnormal; this will probably take several days up to a week to obtain all of the results.  If you do not hear from [place] everything was normal.  Start prednisone taper.  Do not take NSAIDs with this medication including aspirin, ibuprofen/Advil, naproxen/Aleve.  You can use acetaminophen/Tylenol for breakthrough pain.  I have also called in a muscle relaxer to help with your pain.  This will make you sleepy so do not drive or drink alcohol with taking it.  Follow-up with primary care as scheduled.  If you have any changing or worsening symptoms including increasing pain, joint swelling, fever, nausea, vomiting you should be seen immediately.

## 2022-05-23 NOTE — ED Triage Notes (Signed)
Chief Complaint: joint pain, muscle tightness in back. Ongoing for years (17yr). Pain in the lower back and across the upper hips. Worse in the last 2 months. No known injuries or falls recently. Starting to feel the pain in the hands and feet.   Onset: x 2 months   Prescriptions or OTC medications tried: Yes- Aleve    with little relief  Sick exposure: Yes- daughter is sick and being seen today

## 2022-05-23 NOTE — ED Provider Notes (Signed)
[facility]    CSN: [id number] Arrival date & time: 05/23/22  [id number]      History   Chief Complaint Chief Complaint  Patient presents with   Back Pain    HPI Victoria Cook is a 37 y.o. female.   Patient presents today with a prolonged (13-year) history of widespread arthralgias that have worsened in the past 2 months.  She denies any known injury or increase in activity prior to symptom onset.  She reports that pain is rated 4 at rest but increases to 7 with certain activities, worse in the morning when she first wakes up, no alleviating factors identified.  She reports pain is localized to her feet, hands, lower back.  She has tried Aleve with only minimal improvement of symptoms.  Denies formal diagnosis of rheumatoid arthritis but reports that she had some inflammatory markers that were concerning for autoimmune condition when she lived in [place] but never followed up with a rheumatologist for additional evaluation.  She denies any recent tick bites but does report she has had these multiple times in the past.  Denies any recent medication changes or illness.  Denies additional symptoms including fever, nausea, vomiting, weight loss.  She is confident that she is not pregnant.  Denies any recent steroids.  She does have an appointment with primary care to establish care on 06/04/2022 but does not currently have a primary care provider.  She is having difficulty with daily activities as result of symptoms.    Past Medical History:  Diagnosis Date   Asthma    Obesity    Pneumonia     Patient Active Problem List   Diagnosis Date Noted   Gonorrhea in female 03/03/2013    Past Surgical History:  Procedure Laterality Date   left wrist fracture     WRIST SURGERY      OB History   No obstetric history on file.      Home Medications    Prior to Admission medications   Medication Sig Start Date End Date Taking? Authorizing Provider  methocarbamol (ROBAXIN) 500 MG  tablet Take 1 tablet (500 mg total) by mouth 2 (two) times daily. 05/23/22  Yes Othel Hoogendoorn K, PA-C  predniSONE (STERAPRED UNI-PAK 21 TAB) 10 MG (21) TBPK tablet As directed 05/23/22  Yes Seanpatrick Maisano K, PA-C  levonorgestrel (MIRENA) 20 MCG/24HR IUD 1 each by Intrauterine route once.    [provider]    Family History Family History  Problem Relation Age of Onset   Hypertension Mother    Stroke Mother    Cancer Maternal Aunt     Social History Social History   Tobacco Use   Smoking status: Former    Packs/day: 0.15    Types: Cigarettes   Smokeless tobacco: Never  Vaping Use   Vaping Use: Some days   Substances: Flavoring  Substance Use Topics   Alcohol use: Yes    Alcohol/week: 1.0 standard drink of alcohol    Types: 1 Shots of liquor per week   Drug use: Yes    Frequency: 7.0 times per week    Types: Marijuana     Allergies   Patient has no known allergies.   Review of Systems Review of Systems  Constitutional:  Positive for activity change. Negative for appetite change, fatigue and fever.  Respiratory:  Negative for cough and shortness of breath.   Cardiovascular:  Negative for chest pain.  Gastrointestinal:  Negative for abdominal pain, diarrhea, nausea  and vomiting.  Musculoskeletal:  Positive for arthralgias and joint swelling. Negative for gait problem and myalgias.  Neurological:  Negative for weakness and numbness.     Physical Exam Triage Vital Signs ED Triage Vitals  Enc Vitals Group     BP 05/23/22 1022 121/83     Pulse Rate 05/23/22 1022 75     Resp 05/23/22 1022 16     Temp 05/23/22 1022 98.1 F (36.7 C)     Temp Source 05/23/22 1022 Oral     SpO2 05/23/22 1022 98 %     Weight 05/23/22 1021 231 lb 9.6 oz (105.1 kg)     Height 05/23/22 1021 '5\' 6"'$  (1.676 m)     Head Circumference --      Peak Flow --      Pain Score 05/23/22 1019 4     Pain Loc --      Pain Edu? --      Excl. in [place]? --    No data found.  Updated Vital  Signs BP 121/83 (BP Location: Right Arm)   Pulse 75   Temp 98.1 F (36.7 C) (Oral)   Resp 16   Ht '5\' 6"'$  (1.676 m)   Wt 231 lb 9.6 oz (105.1 kg)   LMP 05/05/2022 (Exact Date)   SpO2 98%   BMI 37.38 kg/m   Visual Acuity Right Eye Distance:   Left Eye Distance:   Bilateral Distance:    Right Eye Near:   Left Eye Near:    Bilateral Near:     Physical Exam Vitals reviewed.  Constitutional:      General: She is awake. She is not in acute distress.    Appearance: Normal appearance. She is well-developed. She is not ill-appearing.     Comments: Very pleasant female appears stated age in no acute distress sitting comfortably in exam room  HENT:     Head: Normocephalic and atraumatic.  Cardiovascular:     Rate and Rhythm: Normal rate and regular rhythm.     Heart sounds: Normal heart sounds, S1 normal and S2 normal. No murmur heard. Pulmonary:     Effort: Pulmonary effort is normal.     Breath sounds: Normal breath sounds. No wheezing, rhonchi or rales.     Comments: Clear to auscultation bilaterally Abdominal:     Palpations: Abdomen is soft.     Tenderness: There is no abdominal tenderness.  Musculoskeletal:     Cervical back: No tenderness or bony tenderness.     Thoracic back: No tenderness or bony tenderness.     Lumbar back: No tenderness or bony tenderness.     Comments: Back: No significant pain with percussion of vertebrae.  No tenderness palpation of paraspinal muscles.  Decreased range of motion with forward flexion and rotation.  Hands: Approximately 90% fist formation.  Hands neurovascularly intact bilaterally.  Strength 5/5 bilateral upper and lower extremities.  Psychiatric:        Behavior: Behavior is cooperative.      UC Treatments / Results  Labs (all labs ordered are listed, but only abnormal results are displayed) Labs Reviewed  CBC WITH DIFFERENTIAL/PLATELET  COMPREHENSIVE METABOLIC PANEL  SEDIMENTATION RATE  C-REACTIVE PROTEIN  LYME DISEASE  SEROLOGY W/REFLEX  RHEUMATOID FACTOR  CYCLIC CITRUL PEPTIDE ANTIBODY, IGG/IGA    EKG   Radiology No results found.  Procedures Procedures (including critical care time)  Medications Ordered in UC Medications - No data to display  Initial Impression / Assessment and Plan /  UC Course  I have reviewed the triage vital signs and the nursing notes.  Pertinent labs & imaging results that were available during my care of the patient were reviewed by me and considered in my medical decision making (see chart for details).     Patient is well-appearing, afebrile, nontoxic, nontachycardic.  Given widespread arthralgias with morning stiffness concern for rheumatoid arthritis.  Basic investigational labs including inflammatory markers, CBC, CMP, rheumatoid factor, anti-CCP antibodies, Lyme testing obtained and are pending.  Will treat with prednisone taper as she has failed over-the-counter NSAIDs.  Muscle relaxer was also sent to pharmacy with instruction not to drive or drink alcohol with taking this medication as drowsiness is a common side effect.  Discussed that ultimately she will need to follow-up with rheumatology if she continues to have symptoms so encouraged her to establish care with primary care so they can arrange additional investigation as well as potential referral if appropriate in the future.  She was encouraged to keep this appointment as scheduled.  Discussed that if she has any worsening symptoms including increasing pain, redness/swelling of specific joints, fever, nausea, vomiting she should be seen immediately.  Strict return precautions given.  Work excuse note provided.  Final Clinical Impressions(s) / UC Diagnoses   Final diagnoses:  Polyarthralgia  Stiffness of hand joint, unspecified laterality  Chronic bilateral low back pain without sciatica     Discharge Instructions      We will contact you if your lab work is abnormal; this will probably take several days  up to a week to obtain all of the results.  If you do not hear from [place] everything was normal.  Start prednisone taper.  Do not take NSAIDs with this medication including aspirin, ibuprofen/Advil, naproxen/Aleve.  You can use acetaminophen/Tylenol for breakthrough pain.  I have also called in a muscle relaxer to help with your pain.  This will make you sleepy so do not drive or drink alcohol with taking it.  Follow-up with primary care as scheduled.  If you have any changing or worsening symptoms including increasing pain, joint swelling, fever, nausea, vomiting you should be seen immediately.     ED Prescriptions     Medication Sig Dispense Auth. Provider   predniSONE (STERAPRED UNI-PAK 21 TAB) 10 MG (21) TBPK tablet As directed 21 tablet Nimrit Kehres K, PA-C   methocarbamol (ROBAXIN) 500 MG tablet Take 1 tablet (500 mg total) by mouth 2 (two) times daily. 20 tablet Chase Knebel, Derry Skill, PA-C      PDMP not reviewed this encounter.   Terrilee Croak, PA-C 05/23/22 1055

## 2022-05-24 LAB — RHEUMATOID FACTOR: Rhuematoid fact SerPl-aCnc: <10 IU/mL (ref <14.0)

## 2022-05-25 LAB — CYCLIC CITRUL PEPTIDE ANTIBODY, IGG/IGA: CCP Antibodies IgG/IgA: 6 units (ref 0–19)

## 2022-05-26 LAB — LYME DISEASE SEROLOGY W/REFLEX: Lyme Total Antibody EIA: NEGATIVE

## 2022-12-23 ENCOUNTER — Encounter (HOSPITAL_COMMUNITY): Payer: Self-pay | Admitting: *Deleted

## 2022-12-23 ENCOUNTER — Other Ambulatory Visit: Payer: Self-pay

## 2022-12-23 ENCOUNTER — Ambulatory Visit (HOSPITAL_COMMUNITY)
Admission: EM | Admit: 2022-12-23 | Discharge: 2022-12-23 | Disposition: A | Discharge status: Home / Self Care | Payer: Medicaid Other | Attending: Family Medicine | Admitting: Family Medicine

## 2022-12-23 DIAGNOSIS — M79601 Pain in right arm: Secondary | ICD-10-CM | POA: Diagnosis not present

## 2022-12-23 DIAGNOSIS — M5412 Radiculopathy, cervical region: Secondary | ICD-10-CM

## 2022-12-23 MED ORDER — TIZANIDINE HCL 4 MG PO TABS: 4.0000 mg | ORAL_TABLET | Freq: Three times a day (TID) | ORAL | 0 refills | Status: DC | PRN

## 2022-12-23 MED ORDER — TRAMADOL HCL 50 MG PO TABS: 50.0000 mg | ORAL_TABLET | Freq: Four times a day (QID) | ORAL | 0 refills | Status: DC | PRN

## 2022-12-23 MED ORDER — PREDNISONE 20 MG PO TABS: 40.0000 mg | ORAL_TABLET | Freq: Every day | ORAL | 0 refills | Status: AC

## 2022-12-23 NOTE — ED Provider Notes (Signed)
[facility]    CSN: [id number] Arrival date & time: 12/23/22  1324      History   Chief Complaint Chief Complaint  Patient presents with   Joint Swelling   Shoulder Pain    HPI Victoria Cook is a 37 y.o. female.    Shoulder Pain Here for pain I her right arm around her elbow and now also in her lower neck and right upper back.  About 3 weeks ago she began noticing some pain around her right elbow.  It would bother her with certain motions.  Then yesterday she began having pain in her lower neck toward the right and in her upper back near her neck.  It hurts to turn into raise her arm.  Some of the pain in her elbow is burning in quality.  No paresthesias or numbness noted.  Motor weakness and no bowel or bladder incontinence.  No fever or chills  Currently she is taking meloxicam and methocarbamol for similar back and neck issues.  No history of diabetes.  No fall or trauma No allergies to medications  Past Medical History:  Diagnosis Date   Asthma    Obesity    Pneumonia     Patient Active Problem List   Diagnosis Date Noted   Gonorrhea in female 03/03/2013    Past Surgical History:  Procedure Laterality Date   left wrist fracture     WRIST SURGERY      OB History   No obstetric history on file.      Home Medications    Prior to Admission medications   Medication Sig Start Date End Date Taking? Authorizing Provider  predniSONE (DELTASONE) 20 MG tablet Take 2 tablets (40 mg total) by mouth daily with breakfast for 5 days. 12/23/22 12/28/22 Yes Macai Sisneros, Janace Aris, MD  tiZANidine (ZANAFLEX) 4 MG tablet Take 1 tablet (4 mg total) by mouth every 8 (eight) hours as needed for muscle spasms. 12/23/22  Yes Lailie Smead, Janace Aris, MD  traMADol (ULTRAM) 50 MG tablet Take 1 tablet (50 mg total) by mouth every 6 (six) hours as needed (pain). 12/23/22  Yes Zenia Resides, MD  levonorgestrel (MIRENA) 20 MCG/24HR IUD 1 each by Intrauterine route once.     [provider]    Family History Family History  Problem Relation Age of Onset   Hypertension Mother    Stroke Mother    Cancer Maternal Aunt     Social History Social History   Tobacco Use   Smoking status: Former    Current packs/day: 0.15    Types: Cigarettes   Smokeless tobacco: Never  Vaping Use   Vaping status: Some Days   Substances: Flavoring  Substance Use Topics   Alcohol use: Yes    Alcohol/week: 1.0 standard drink of alcohol    Types: 1 Shots of liquor per week   Drug use: Yes    Frequency: 7.0 times per week    Types: Marijuana     Allergies   Patient has no known allergies.   Review of Systems Review of Systems   Physical Exam Triage Vital Signs ED Triage Vitals  Encounter Vitals Group     BP 12/23/22 0954 119/81     Systolic BP Percentile --      Diastolic BP Percentile --      Pulse Rate 12/23/22 0954 77     Resp 12/23/22 0954 16     Temp 12/23/22 0954 98.3 F (36.8 C)  Temp src --      SpO2 12/23/22 0954 98 %     Weight --      Height --      Head Circumference --      Peak Flow --      Pain Score 12/23/22 0952 5     Pain Loc --      Pain Education --      Exclude from Growth Chart --    No data found.  Updated Vital Signs BP 119/81   Pulse 77   Temp 98.3 F (36.8 C)   Resp 16   LMP 12/20/2022   SpO2 98%   Visual Acuity Right Eye Distance:   Left Eye Distance:   Bilateral Distance:    Right Eye Near:   Left Eye Near:    Bilateral Near:     Physical Exam Vitals reviewed.  Constitutional:      General: She is not in acute distress.    Appearance: She is not ill-appearing, toxic-appearing or diaphoretic.  HENT:     Mouth/Throat:     Mouth: Mucous membranes are moist.  Musculoskeletal:     Comments: Tender right lower neck/trapezius. No rash  Skin:    Coloration: Skin is not jaundiced or pale.  Neurological:     General: No focal deficit present.     Mental Status: She is alert and oriented to  person, place, and time.     Comments: Right grip normal.  No deformity of the right arm or elbow.  Psychiatric:        Behavior: Behavior normal.      UC Treatments / Results  Labs (all labs ordered are listed, but only abnormal results are displayed) Labs Reviewed - No data to display  EKG   Radiology No results found.  Procedures Procedures (including critical care time)  Medications Ordered in UC Medications - No data to display  Initial Impression / Assessment and Plan / UC Course  I have reviewed the triage vital signs and the nursing notes.  Pertinent labs & imaging results that were available during my care of the patient were reviewed by me and considered in my medical decision making (see chart for details).        For now she will not take her meloxicam and methocarbamol.  5-day burst of prednisone is sent in and tizanidine is sent in, as the meloxicam and methocarbamol were not helping.   also a small quantity of tramadol are sent in for pain relief.  She will follow-up with her primary care.  She is also given contact information for orthopedics. Final Clinical Impressions(s) / UC Diagnoses   Final diagnoses:  Cervical radiculopathy  Right arm pain     Discharge Instructions      Take prednisone 20 mg--2 daily for 5 days  Take tramadol 50 mg-- 1 tablet every 6 hours as needed for pain.  This medication can make you sleepy or dizzy   Take tizanidine 4 mg--1 every 8 hours as needed for muscle spasms; this medication can cause dizziness and sleepiness  While you are taking these medications, do not take meloxicam or methocarbamol.  Please follow-up with your primary care      ED Prescriptions     Medication Sig Dispense Auth. Provider   predniSONE (DELTASONE) 20 MG tablet Take 2 tablets (40 mg total) by mouth daily with breakfast for 5 days. 10 tablet Zenia Resides, MD   traMADol (ULTRAM) 50 MG  tablet Take 1 tablet (50 mg total) by  mouth every 6 (six) hours as needed (pain). 12 tablet Arisa Congleton, Janace Aris, MD   tiZANidine (ZANAFLEX) 4 MG tablet Take 1 tablet (4 mg total) by mouth every 8 (eight) hours as needed for muscle spasms. 15 tablet Graham Hyun, Janace Aris, MD      I have reviewed the PDMP during this encounter.   Zenia Resides, MD 12/23/22 1048

## 2022-12-23 NOTE — Discharge Instructions (Signed)
Take prednisone 20 mg--2 daily for 5 days  Take tramadol 50 mg-- 1 tablet every 6 hours as needed for pain.  This medication can make you sleepy or dizzy   Take tizanidine 4 mg--1 every 8 hours as needed for muscle spasms; this medication can cause dizziness and sleepiness  While you are taking these medications, do not take meloxicam or methocarbamol.  Please follow-up with your primary care

## 2022-12-23 NOTE — ED Triage Notes (Signed)
Pt reports elbow pain started 3weeks ago and pain now radiates up and down her Rt arm.Pt also reports she does not know how she did it but when she was side stepping around her bed her Rt should started having muscle spasam.

## 2023-01-27 ENCOUNTER — Other Ambulatory Visit: Payer: Self-pay

## 2023-01-27 ENCOUNTER — Encounter (HOSPITAL_COMMUNITY): Payer: Self-pay | Admitting: Emergency Medicine

## 2023-01-27 ENCOUNTER — Ambulatory Visit (HOSPITAL_COMMUNITY)
Admission: EM | Admit: 2023-01-27 | Discharge: 2023-01-27 | Disposition: A | Discharge status: Home / Self Care | Payer: Medicaid Other | Attending: Family Medicine | Admitting: Family Medicine

## 2023-01-27 DIAGNOSIS — M7711 Lateral epicondylitis, right elbow: Secondary | ICD-10-CM

## 2023-01-27 MED ORDER — PREDNISONE 10 MG (48) PO TBPK: ORAL_TABLET | ORAL | 0 refills | Status: DC

## 2023-01-27 NOTE — ED Triage Notes (Signed)
Patient has pain in right elbow.  Points to a specific spot on elbow .  Sends a burning sensation down forearm.  Pain for the last 3 weeks.    Patient has a history of right shoulder pain that she has been working with her pcp to determine treatment.  Shoulder pain for 2 years.  2 months ago started to try to find a cause and treatment .    Patient is right handed.    Patient reports having been treated with meloxicam, methocarbamol, tizanidine, tramadol

## 2023-01-27 NOTE — ED Provider Notes (Signed)
[facility]   [id number] 01/27/23 Arrival Time: 1005  ASSESSMENT & PLAN:  1. Lateral epicondylitis of right elbow    No indication for plain imaging. Will tx as tennis elbow.  Discharge Medication List as of 01/27/2023 10:43 AM     START taking these medications   Details  predniSONE (STERAPRED UNI-PAK 48 TAB) 10 MG (48) TBPK tablet Take as directed., Normal        Orders Placed This Encounter  Procedures   Apply other splint  Tennis elbow brace applied. Work/school excuse note: provided. Recommend:  Follow-up Information     Schedule an appointment as soon as possible for a visit  with [facility].   Contact information: [address] [place] [address] [phone]                Reviewed expectations re: course of current medical issues. Questions answered. Outlined signs and symptoms indicating need for more acute intervention. Patient verbalized understanding. After Visit Summary given.  SUBJECTIVE: History from: patient. Victoria Cook is a 37 y.o. 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 Some distal paresthesias at times. OTC NSAID without relief. Denies elbow trauma.  Past Surgical History:  Procedure Laterality Date   left wrist fracture     WRIST SURGERY       OBJECTIVE:  Vitals:   01/27/23 1021  BP: 114/76  Pulse: 80  Resp: 20  Temp: 98.4 F (36.9 C)  TempSrc: Oral  SpO2: 97%    General appearance: alert; no distress HEENT: [place]; AT Neck: supple with FROM Resp: unlabored respirations Extremities: RUE: warm with well perfused appearance; fairly well localized moderate tenderness over right lateral epicondyle; without gross deformities; swelling: none; bruising: none; elbow and wrist ROM: normal CV: brisk extremity capillary refill of RUE;  2+ radial pulse of RUE. Skin: warm and dry; no visible rashes Neurologic: gait normal; normal sensation and strength of RUE Psychological: alert and cooperative; normal mood and affect  No Known Allergies  Past Medical History:  Diagnosis Date   Asthma    Obesity    Pneumonia    Social History   Socioeconomic History   Marital status: Divorced    Spouse name: Not on file   Number of children: Not on file   Years of education: Not on file   Highest education level: Not on file  Occupational History   Not on file  Tobacco Use   Smoking status: Former    Current packs/day: 0.15    Types: Cigarettes   Smokeless tobacco: Never  Vaping Use   Vaping status: Former   Substances: Flavoring  Substance and Sexual Activity   Alcohol use: Yes    Alcohol/week: 1.0 standard drink of alcohol    Types: 1 Shots of liquor per week   Drug use: Yes    Frequency: 7.0 times per week    Types: Marijuana   Sexual activity: Yes    Birth control/protection: I.U.D.  Other Topics Concern   Not on file  Social History Narrative   Not on file   Social Determinants of Health   Financial Resource Strain: Not on file  Food Insecurity: Not on file  Transportation Needs: Not on file  Physical Activity: Not on file  Stress: Not on file  Social Connections: Not on file  Family History  Problem Relation Age of Onset   Hypertension Mother    Stroke Mother    Cancer Maternal Aunt    Past Surgical History:  Procedure Laterality Date   left wrist fracture     WRIST SURGERY         Mardella Layman, MD 01/27/23 1131

## 2023-01-27 NOTE — ED Notes (Signed)
Reviewed work note 

## 2023-04-07 ENCOUNTER — Other Ambulatory Visit: Payer: Self-pay

## 2023-04-07 ENCOUNTER — Emergency Department (HOSPITAL_COMMUNITY)
Admission: EM | Admit: 2023-04-07 | Discharge: 2023-04-07 | Disposition: A | Discharge status: Home / Self Care | Payer: Medicaid Other | Attending: Emergency Medicine | Admitting: Emergency Medicine

## 2023-04-07 DIAGNOSIS — M25521 Pain in right elbow: Secondary | ICD-10-CM | POA: Insufficient documentation

## 2023-04-07 MED ORDER — DICLOFENAC SODIUM 1 % EX CREA: 1.0000 | TOPICAL_CREAM | Freq: Three times a day (TID) | CUTANEOUS | 0 refills | Status: AC

## 2023-04-07 MED ORDER — PREDNISONE 20 MG PO TABS
60.0000 mg | ORAL_TABLET | ORAL | Status: AC
  Administered 2023-04-07: 60 mg via ORAL
  Filled 2023-04-07: qty 3

## 2023-04-07 MED ORDER — PREDNISONE 20 MG PO TABS: 40.0000 mg | ORAL_TABLET | Freq: Every day | ORAL | 0 refills | Status: AC

## 2023-04-07 NOTE — ED Provider Notes (Signed)
 [place] [facility] Provider Note   CSN: [id number] Arrival date & time: 04/07/23  9164     History  Chief Complaint  Patient presents with   Elbow Pain    Victoria Cook is a 38 y.o. female.  HPI Patient presents with right elbow.  Duration for several weeks, possibly months.  Patient works in [employer]  She has been seen, evaluated has tried OTC medication, has recurrent soreness with difficulty with extension in the elbow.  No discoloration, no fever, chills, distal loss of sensation or weakness.    Home Medications Prior to Admission medications   Medication Sig Start Date End Date Taking? Authorizing Provider  Diclofenac  Sodium 1 % CREA Apply 1 Application topically in the morning, at noon, and at bedtime. 04/07/23  Yes Garrick Charleston, MD  predniSONE  (DELTASONE ) 20 MG tablet Take 2 tablets (40 mg total) by mouth daily with breakfast. For the next four days 04/07/23  Yes Garrick Charleston, MD  levonorgestrel [facility]) 20 MCG/24HR IUD 1 each by Intrauterine route once.    [provider]  pantoprazole  (PROTONIX ) 20 MG tablet Take 20 mg by mouth daily. 11/06/22   [provider]  Vitamin D, Ergocalciferol, (DRISDOL) 1.25 MG (50000 UNIT) CAPS capsule Take 50,000 Units by mouth once a week. 09/15/22   [provider]      Allergies    Patient has no known allergies.    Review of Systems   Review of Systems  Physical Exam Updated Vital Signs BP 135/81 (BP Location: Right Arm)   Pulse 86   Temp 98.5 F (36.9 C)   Resp 16   Ht 5' 6 (1.676 m)   Wt 110.2 kg   SpO2 100%   BMI 39.22 kg/m  Physical Exam Vitals and nursing note reviewed.  Constitutional:      General: She is not in acute distress.    Appearance: She is well-developed.  HENT:     Head: Normocephalic and atraumatic.  Eyes:     Conjunctiva/sclera: Conjunctivae normal.  Cardiovascular:     Rate and Rhythm: Normal rate and regular  rhythm.     Pulses: Normal pulses.  Pulmonary:     Effort: Pulmonary effort is normal. No respiratory distress.     Breath sounds: No stridor.  Abdominal:     General: There is no distension.  Musculoskeletal:       Arms:  Skin:    General: Skin is warm and dry.  Neurological:     Mental Status: She is alert and oriented to person, place, and time.     Cranial Nerves: No cranial nerve deficit.  Psychiatric:        Mood and Affect: Mood normal.     ED Results / Procedures / Treatments   Labs (all labs ordered are listed, but only abnormal results are displayed) Labs Reviewed - No data to display  EKG None  Radiology No results found.  Procedures Procedures    Medications Ordered in ED Medications  predniSONE  (DELTASONE ) tablet 60 mg (has no administration in time range)    ED Course/ Medical Decision Making/ A&P                                 Medical Decision Making Adult female presents with elbow pain isolated, no evidence for septic arthritis, bursitis or other intra-articular process suspicion for repetitive use  disorder.  Patient started on anti-inflammatories, will follow-up with the pedis.  Risk Prescription drug management.  Final Clinical Impression(s) / ED Diagnoses Final diagnoses:  Right elbow pain    Rx / DC Orders ED Discharge Orders          Ordered    predniSONE  (DELTASONE ) 20 MG tablet  Daily with breakfast        04/07/23 0918    Diclofenac  Sodium 1 % CREA  3 times daily        04/07/23 0918              Garrick Charleston, MD 04/07/23 [phone]

## 2023-04-07 NOTE — ED Triage Notes (Signed)
 Pt. Stated, Ive had elbow pain for 3-4 months. I did see someone at [facility] a month and half and have been here also.

## 2023-04-07 NOTE — Discharge Instructions (Signed)
 Your elbow pain is likely inflammatory, due to chronic use disorder.  Please use the prescribed anti-inflammatory cream and the prescribed steroids for relief.  Follow-up with our orthopedic colleagues or return here for concerning changes in your condition.

## 2023-04-13 ENCOUNTER — Encounter (HOSPITAL_COMMUNITY): Payer: Self-pay | Admitting: Emergency Medicine

## 2023-04-13 ENCOUNTER — Other Ambulatory Visit: Payer: Self-pay

## 2023-04-13 ENCOUNTER — Emergency Department (HOSPITAL_COMMUNITY)
Admission: EM | Admit: 2023-04-13 | Discharge: 2023-04-14 | Discharge status: Against Medical Advice | Payer: Medicaid Other | Attending: Emergency Medicine | Admitting: Emergency Medicine

## 2023-04-13 DIAGNOSIS — M542 Cervicalgia: Secondary | ICD-10-CM | POA: Insufficient documentation

## 2023-04-13 DIAGNOSIS — Z5321 Procedure and treatment not carried out due to patient leaving prior to being seen by health care provider: Secondary | ICD-10-CM | POA: Insufficient documentation

## 2023-04-13 NOTE — ED Provider Triage Note (Signed)
 Emergency Medicine Provider Triage Evaluation Note  Victoria Cook , a 38 y.o. female  was evaluated in triage.  Pt complains of neck pain. States ago and has been persistent since then.  Denies history of similar, was seen at urgent care in September for cervical radiculopathy, states this feels different.  Pain is bilateral.  Denies any weakness or numbness in her extremities and.  No headaches or vision changes.  No injury.  Review of Systems  Positive:  Negative:   Physical Exam  BP (!) 142/80 (BP Location: Right Arm)   Pulse 80   Temp 98.1 F (36.7 C)   Resp 16   SpO2 100%  Gen:   Awake, no distress   Resp:  Normal effort  MSK:   Moves extremities without difficulty  Other:  No midline tenderness to palpation of the cervical spine  Medical Decision Making  Medically screening exam initiated at 4:38 PM.  Appropriate orders placed.  TALENE GLASTETTER was informed that the remainder of the evaluation will be completed by another provider, this initial triage assessment does not replace that evaluation, and the importance of remaining in the ED until their evaluation is complete.  Work-up initiated   Rodrick Payson A, PA-C 04/13/23 1640

## 2023-04-13 NOTE — ED Triage Notes (Signed)
 Pt reports neck stiffness upon waking x 1 week. Pt denies fever or visual changes. Pt denies injury to the area.

## 2023-04-14 NOTE — ED Notes (Signed)
 Called pt x3 to obtain new set of v/s, no answer.  KM

## 2023-04-17 ENCOUNTER — Emergency Department (HOSPITAL_COMMUNITY)
Admission: EM | Admit: 2023-04-17 | Discharge: 2023-04-17 | Discharge status: Against Medical Advice | Payer: Medicaid Other | Attending: Emergency Medicine | Admitting: Emergency Medicine

## 2023-04-17 ENCOUNTER — Other Ambulatory Visit: Payer: Self-pay

## 2023-04-17 ENCOUNTER — Encounter (HOSPITAL_COMMUNITY): Payer: Self-pay | Admitting: Emergency Medicine

## 2023-04-17 DIAGNOSIS — M542 Cervicalgia: Secondary | ICD-10-CM | POA: Diagnosis present

## 2023-04-17 DIAGNOSIS — Z5321 Procedure and treatment not carried out due to patient leaving prior to being seen by health care provider: Secondary | ICD-10-CM | POA: Diagnosis not present

## 2023-04-17 LAB — CBC WITH DIFFERENTIAL/PLATELET
Abs Immature Granulocytes: 0.03 10*3/uL (ref 0.00–0.07)
Basophils Absolute: 0 10*3/uL (ref 0.0–0.1)
Basophils Relative: 0 %
Eosinophils Absolute: 0.1 10*3/uL (ref 0.0–0.5)
Eosinophils Relative: 2 %
HCT: 41.1 % (ref 36.0–46.0)
Hemoglobin: 13.5 g/dL (ref 12.0–15.0)
Immature Granulocytes: 0 %
Lymphocytes Relative: 24 %
Lymphs Abs: 2.1 10*3/uL (ref 0.7–4.0)
MCH: 27.6 pg (ref 26.0–34.0)
MCHC: 32.8 g/dL (ref 30.0–36.0)
MCV: 83.9 fL (ref 80.0–100.0)
Monocytes Absolute: 0.7 10*3/uL (ref 0.1–1.0)
Monocytes Relative: 8 %
Neutro Abs: 5.7 10*3/uL (ref 1.7–7.7)
Neutrophils Relative %: 66 %
Platelets: 263 10*3/uL (ref 150–400)
RBC: 4.9 MIL/uL (ref 3.87–5.11)
RDW: 14.5 % (ref 11.5–15.5)
WBC: 8.7 10*3/uL (ref 4.0–10.5)
nRBC: 0 % (ref 0.0–0.2)

## 2023-04-17 LAB — BASIC METABOLIC PANEL
Anion gap: 8 (ref 5–15)
BUN: 8 mg/dL (ref 6–20)
CO2: 23 mmol/L (ref 22–32)
Calcium: 9.7 mg/dL (ref 8.9–10.3)
Chloride: 108 mmol/L (ref 98–111)
Creatinine, Ser: 0.94 mg/dL (ref 0.44–1.00)
GFR, Estimated: >60 mL/min (ref >60)
Glucose, Bld: 95 mg/dL (ref 70–99)
Potassium: 4 mmol/L (ref 3.5–5.1)
Sodium: 139 mmol/L (ref 135–145)

## 2023-04-17 NOTE — ED Triage Notes (Signed)
Pt reports neck pain since last Wednesday, denies recent fever. Pt reports taking partial dose of steroids as well as muscle relaxers with no improvement. VSS.

## 2024-04-29 ENCOUNTER — Telehealth: Admitting: Physician Assistant

## 2024-04-29 DIAGNOSIS — R051 Acute cough: Secondary | ICD-10-CM

## 2024-04-29 NOTE — Patient Instructions (Addendum)
 SABRA

## 2024-04-29 NOTE — Progress Notes (Signed)
 Patient states she was attempting to register an appointment for her daughter. Visit was put under her name in error.
